# Patient Record
Sex: Female | Born: 1950 | Race: Black or African American | Hispanic: No | Marital: Married | Smoking: Former smoker
Health system: Southern US, Community
[De-identification: ages and names within clinical notes are randomized; demographics above are authoritative.]

## PROBLEM LIST (undated history)

## (undated) DIAGNOSIS — Z87898 Personal history of other specified conditions: Secondary | ICD-10-CM

## (undated) DIAGNOSIS — I739 Peripheral vascular disease, unspecified: Secondary | ICD-10-CM

## (undated) DIAGNOSIS — I6529 Occlusion and stenosis of unspecified carotid artery: Secondary | ICD-10-CM

## (undated) DIAGNOSIS — R0602 Shortness of breath: Secondary | ICD-10-CM

## (undated) DIAGNOSIS — Z8709 Personal history of other diseases of the respiratory system: Secondary | ICD-10-CM

## (undated) DIAGNOSIS — I071 Rheumatic tricuspid insufficiency: Secondary | ICD-10-CM

## (undated) HISTORY — DX: Shortness of breath: R06.02

## (undated) HISTORY — DX: Personal history of other diseases of the respiratory system: Z87.09

## (undated) HISTORY — DX: Rheumatic tricuspid insufficiency: I07.1

## (undated) HISTORY — DX: Occlusion and stenosis of unspecified carotid artery: I65.29

## (undated) HISTORY — DX: Peripheral vascular disease, unspecified: I73.9

## (undated) HISTORY — DX: Personal history of other specified conditions: Z87.898

---
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
## 2014-08-19 ENCOUNTER — Emergency Department (HOSPITAL_COMMUNITY): Payer: Medicaid Other

## 2014-08-19 ENCOUNTER — Emergency Department (HOSPITAL_COMMUNITY)
Admission: EM | Admit: 2014-08-19 | Discharge: 2014-08-19 | Disposition: A | Discharge status: Home / Self Care | Payer: Medicaid Other | Attending: Emergency Medicine | Admitting: Emergency Medicine

## 2014-08-19 ENCOUNTER — Encounter (HOSPITAL_COMMUNITY): Payer: Self-pay | Admitting: *Deleted

## 2014-08-19 ENCOUNTER — Emergency Department (HOSPITAL_BASED_OUTPATIENT_CLINIC_OR_DEPARTMENT_OTHER): Payer: Medicaid Other

## 2014-08-19 ENCOUNTER — Emergency Department (INDEPENDENT_AMBULATORY_CARE_PROVIDER_SITE_OTHER)
Admission: EM | Admit: 2014-08-19 | Discharge: 2014-08-19 | Disposition: A | Discharge status: Nursing Home (Includes ICF) | Payer: Medicaid Other | Source: Home / Self Care | Attending: Emergency Medicine | Admitting: Emergency Medicine

## 2014-08-19 DIAGNOSIS — M549 Dorsalgia, unspecified: Secondary | ICD-10-CM | POA: Diagnosis not present

## 2014-08-19 DIAGNOSIS — M79609 Pain in unspecified limb: Secondary | ICD-10-CM

## 2014-08-19 DIAGNOSIS — J441 Chronic obstructive pulmonary disease with (acute) exacerbation: Secondary | ICD-10-CM | POA: Diagnosis not present

## 2014-08-19 DIAGNOSIS — R059 Cough, unspecified: Secondary | ICD-10-CM

## 2014-08-19 DIAGNOSIS — M791 Myalgia, unspecified site: Secondary | ICD-10-CM

## 2014-08-19 DIAGNOSIS — R7981 Abnormal blood-gas level: Secondary | ICD-10-CM

## 2014-08-19 DIAGNOSIS — R2243 Localized swelling, mass and lump, lower limb, bilateral: Secondary | ICD-10-CM | POA: Diagnosis not present

## 2014-08-19 DIAGNOSIS — R600 Localized edema: Secondary | ICD-10-CM | POA: Diagnosis not present

## 2014-08-19 DIAGNOSIS — R05 Cough: Secondary | ICD-10-CM | POA: Diagnosis present

## 2014-08-19 DIAGNOSIS — G8929 Other chronic pain: Secondary | ICD-10-CM

## 2014-08-19 DIAGNOSIS — M25561 Pain in right knee: Secondary | ICD-10-CM | POA: Diagnosis not present

## 2014-08-19 DIAGNOSIS — Z720 Tobacco use: Secondary | ICD-10-CM

## 2014-08-19 DIAGNOSIS — M25562 Pain in left knee: Secondary | ICD-10-CM

## 2014-08-19 MED ORDER — ALBUTEROL SULFATE HFA 108 (90 BASE) MCG/ACT IN AERS
2.0000 | INHALATION_SPRAY | RESPIRATORY_TRACT | Status: DC | PRN
Start: 2014-08-19 — End: 2014-08-20
  Filled 2014-08-19: qty 6.7

## 2014-08-19 MED ORDER — HYDROCODONE-HOMATROPINE 5-1.5 MG/5ML PO SYRP: 5.0000 mL | ORAL_SOLUTION | Freq: Four times a day (QID) | ORAL | Status: DC | PRN

## 2014-08-19 MED ORDER — PREDNISONE 20 MG PO TABS
60.0000 mg | ORAL_TABLET | Freq: Once | ORAL | Status: AC
  Administered 2014-08-19: 60 mg via ORAL
  Filled 2014-08-19: qty 3

## 2014-08-19 MED ORDER — LEVOFLOXACIN 750 MG PO TABS: 750.0000 mg | ORAL_TABLET | Freq: Every day | ORAL | Status: DC

## 2014-08-19 MED ORDER — PREDNISONE 20 MG PO TABS: 60.0000 mg | ORAL_TABLET | Freq: Once | ORAL | Status: DC

## 2014-08-19 MED ORDER — LEVOFLOXACIN 750 MG PO TABS
750.0000 mg | ORAL_TABLET | Freq: Once | ORAL | Status: AC
  Administered 2014-08-19: 750 mg via ORAL
  Filled 2014-08-19: qty 1

## 2014-08-19 MED ORDER — PREDNISONE 20 MG PO TABS: 40.0000 mg | ORAL_TABLET | Freq: Every day | ORAL | Status: DC

## 2014-08-19 NOTE — ED Notes (Signed)
Pt ambulated approx. 25 feet oxygen sats maintained 92-93% on room air. Pt states minor sob and also severe leg pain. nad noted. PA notified.

## 2014-08-19 NOTE — ED Provider Notes (Signed)
CSN: [id number]     Arrival date & time 08/19/14  1821 History   First MD Initiated Contact with Patient 08/19/14 1930     Chief Complaint  Patient presents with  . Leg Swelling     (Consider location/radiation/quality/duration/timing/severity/associated sxs/prior Treatment) HPI Comments: Patient presents to the emergency department with chief complaint of bilateral leg swelling 2 months, but worsened in the past 2 days. Patient seen at urgent care earlier today and sent to ED for evaluation of lower extremity swelling. Patient recently arrived to the [place] from [place].  She denies any chest pain or shortness of breath.  She states that she has had a persistent cough x months.  She denies any associated fevers, chills, nausea, vomiting, diarrhea.  Denies history of DVT or PE.  The history is provided by the patient. No language interpreter was used.    History reviewed. No pertinent past medical history. Past Surgical History  Procedure Laterality Date  . Appendectomy     History reviewed. No pertinent family history. History  Substance Use Topics  . Smoking status: Never Smoker   . Smokeless tobacco: Not on file  . Alcohol Use: Yes   OB History    No data available     Review of Systems  Constitutional: Negative for fever and chills.  Respiratory: Positive for cough. Negative for shortness of breath.   Cardiovascular: Positive for leg swelling. Negative for chest pain.  Gastrointestinal: Negative for nausea, vomiting, diarrhea and constipation.  Genitourinary: Negative for dysuria.  All other systems reviewed and are negative.     Allergies  Review of patient's allergies indicates no known allergies.  Home Medications   Prior to Admission medications   Not on File   BP 126/72 mmHg  Pulse 70  Temp(Src) 98.2 F (36.8 C) (Oral)  Resp 18  SpO2 95% Physical Exam  Constitutional: She is oriented to person, place, and time. She appears well-developed and  well-nourished.  HENT:  Head: Normocephalic and atraumatic.  Eyes: Conjunctivae and EOM are normal. Pupils are equal, round, and reactive to light.  Neck: Normal range of motion. Neck supple.  Cardiovascular: Normal rate and regular rhythm.  Exam reveals no gallop and no friction rub.   No murmur heard. Pulmonary/Chest: Effort normal and breath sounds normal. No respiratory distress. She has no wheezes. She has no rales. She exhibits no tenderness.  CTAB  Abdominal: Soft. Bowel sounds are normal. She exhibits no distension and no mass. There is no tenderness. There is no rebound and no guarding.  Musculoskeletal: Normal range of motion. She exhibits edema. She exhibits no tenderness.  2+ pitting edema of lower extremities, no sign of abscess or cellulitis Moves all extremities No bony abnormality or deformity  Neurological: She is alert and oriented to person, place, and time.  Skin: Skin is warm and dry.  Psychiatric: She has a normal mood and affect. Her behavior is normal. Judgment and thought content normal.  Nursing note and vitals reviewed.   ED Course  Procedures (including critical care time) No results found for this or any previous visit. Dg Chest 2 View  08/19/2014   CLINICAL DATA:  Bilateral leg swelling x2 months, increased x2 days  EXAM: CHEST  2 VIEW  COMPARISON:  None.  FINDINGS: Patchy bilateral lower lobe opacities, atelectasis versus pneumonia. No frank interstitial edema. No pleural effusion or pneumothorax.  Heart is top-normal in size.  Mild degenerative changes of the visualized thoracolumbar spine.  IMPRESSION: Patchy bilateral lower lobe  opacities, atelectasis versus pneumonia.  No frank interstitial edema.   Electronically Signed   By: Charline Bills M.D.   On: 08/19/2014 22:02      EKG Interpretation None      MDM   Final diagnoses:  Cough  COPD exacerbation    Patient with bilateral lower extremity swelling, recent long travel from [place] to [place].  Will check lower extremity Dopplers.  Lower extremities are negative for DVT. No evidence of abscess or cellulitis. Patient has a history of chronic knee pain.  Patient has a significant amount coughing on exam. I will order chest x-ray for further investigation. Patient does not have any fever, chills, nausea, vomiting, diarrhea, doubt MERS.  Patient is a heavy smoker, oxygen saturation is on the low side at 92%. Chest x-ray shows bilateral opacities, consider pneumonia versus atelectasis. Patient discussed with Dr. Silverio Lay, who recommends ambulating the patient.  Doubt PE.  Negative DVTs.  No chest pain.  Cough is chronic.  No SOB.  Patient ambulates maintaining 92-93% oxygenation. This is likely the patient's baseline. Will treat opacities with Levaquin, prednisone, and an inhaler. Will also give Hycodan for cough/comfort. Patient understands and agrees with the plan. She is stable and ready for discharge.     Roxy Horseman, PA-C 08/19/14 2337  Richardean Canal, MD 08/20/14 [phone]

## 2014-08-19 NOTE — ED Notes (Signed)
Pt sent here from [facility], reports swelling to bilateral legs x 2 months but more severe x 2 days. Recent travel from [place]. Denies any cp or sob.

## 2014-08-19 NOTE — Discharge Instructions (Signed)
Cough, Adult  A cough is a reflex that helps clear your throat and airways. It can help heal the body or may be a reaction to an irritated airway. A cough may only last 2 or 3 weeks (acute) or may last more than 8 weeks (chronic).  CAUSES Acute cough:  Viral or bacterial infections. Chronic cough:  Infections.  Allergies.  Asthma.  Post-nasal drip.  Smoking.  Heartburn or acid reflux.  Some medicines.  Chronic lung problems (COPD).  Cancer. SYMPTOMS   Cough.  Fever.  Chest pain.  Increased breathing rate.  High-pitched whistling sound when breathing (wheezing).  Colored mucus that you cough up (sputum). TREATMENT   A bacterial cough may be treated with antibiotic medicine.  A viral cough must run its course and will not respond to antibiotics.  Your caregiver may recommend other treatments if you have a chronic cough. HOME CARE INSTRUCTIONS   Only take over-the-counter or prescription medicines for pain, discomfort, or fever as directed by your caregiver. Use cough suppressants only as directed by your caregiver.  Use a cold steam vaporizer or humidifier in your bedroom or home to help loosen secretions.  Sleep in a semi-upright position if your cough is worse at night.  Rest as needed.  Stop smoking if you smoke. SEEK IMMEDIATE MEDICAL CARE IF:   You have pus in your sputum.  Your cough starts to worsen.  You cannot control your cough with suppressants and are losing sleep.  You begin coughing up blood.  You have difficulty breathing.  You develop pain which is getting worse or is uncontrolled with medicine.  You have a fever. MAKE SURE YOU:   Understand these instructions.  Will watch your condition.  Will get help right away if you are not doing well or get worse. Document Released: 08/24/2010 Document Revised: 05/20/2011 Document Reviewed: 08/24/2010 [facility] Patient Information 2015 [place], [place]. This information is not intended  to replace advice given to you by your health care provider. Make sure you discuss any questions you have with your health care provider. Edema Edema is an abnormal buildup of fluids in your bodytissues. Edema is somewhatdependent on gravity to pull the fluid to the lowest place in your body. That makes the condition more common in the legs and thighs (lower extremities). Painless swelling of the feet and ankles is common and becomes more likely as you get older. It is also common in looser tissues, like around your eyes.  When the affected area is squeezed, the fluid may move out of that spot and leave a dent for a few moments. This dent is called pitting.  CAUSES  There are many possible causes of edema. Eating too much salt and being on your feet or sitting for a long time can cause edema in your legs and ankles. Hot weather may make edema worse. Common medical causes of edema include:  Heart failure.  Liver disease.  Kidney disease.  Weak blood vessels in your legs.  Cancer.  An injury.  Pregnancy.  Some medications.  Obesity. SYMPTOMS  Edema is usually painless.Your skin may look swollen or shiny.  DIAGNOSIS  Your health care provider may be able to diagnose edema by asking about your medical history and doing a physical exam. You may need to have tests such as X-rays, an electrocardiogram, or blood tests to check for medical conditions that may cause edema.  TREATMENT  Edema treatment depends on the cause. If you have heart, liver, or kidney disease,  you need the treatment appropriate for these conditions. General treatment may include:  Elevation of the affected body part above the level of your heart.  Compression of the affected body part. Pressure from elastic bandages or support stockings squeezes the tissues and forces fluid back into the blood vessels. This keeps fluid from entering the tissues.  Restriction of fluid and salt intake.  Use of a water pill  (diuretic). These medications are appropriate only for some types of edema. They pull fluid out of your body and make you urinate more often. This gets rid of fluid and reduces swelling, but diuretics can have side effects. Only use diuretics as directed by your health care provider. HOME CARE INSTRUCTIONS   Keep the affected body part above the level of your heart when you are lying down.   Do not sit still or stand for prolonged periods.   Do not put anything directly under your knees when lying down.  Do not wear constricting clothing or garters on your upper legs.   Exercise your legs to work the fluid back into your blood vessels. This may help the swelling go down.   Wear elastic bandages or support stockings to reduce ankle swelling as directed by your health care provider.   Eat a low-salt diet to reduce fluid if your health care provider recommends it.   Only take medicines as directed by your health care provider. SEEK MEDICAL CARE IF:   Your edema is not responding to treatment.  You have heart, liver, or kidney disease and notice symptoms of edema.  You have edema in your legs that does not improve after elevating them.   You have sudden and unexplained weight gain. SEEK IMMEDIATE MEDICAL CARE IF:   You develop shortness of breath or chest pain.   You cannot breathe when you lie down.  You develop pain, redness, or warmth in the swollen areas.   You have heart, liver, or kidney disease and suddenly get edema.  You have a fever and your symptoms suddenly get worse. MAKE SURE YOU:   Understand these instructions.  Will watch your condition.  Will get help right away if you are not doing well or get worse. Document Released: 02/25/2005 Document Revised: 07/12/2013 Document Reviewed: 12/18/2012 [facility] Patient Information 2015 [place], [place]. This information is not intended to replace advice given to you by your health care provider. Make sure you  discuss any questions you have with your health care provider.

## 2014-08-19 NOTE — Progress Notes (Signed)
VASCULAR LAB PRELIMINARY  PRELIMINARY  PRELIMINARY  PRELIMINARY  Bilateral lower extremity venous duplex completed.    Preliminary report:  Bilateral:  No evidence of DVT, superficial thrombosis, or Baker's Cyst.   Tiffanyann Deroo, RVS 08/19/2014, 8:58 PM

## 2014-08-19 NOTE — ED Notes (Signed)
Pt going to xray  

## 2014-08-19 NOTE — ED Provider Notes (Signed)
CSN: [id number]     Arrival date & time 08/19/14  1618 History   First MD Initiated Contact with Patient 08/19/14 1710     Chief Complaint  Patient presents with  . Foot Swelling   (Consider location/radiation/quality/duration/timing/severity/associated sxs/prior Treatment) HPI Comments: 64 year old morbidly obese [place] female arrived to [place]: [place] 2 days ago. There is an interpreter here with her. She is here for acute and chronic problems to be managed. She states that she has had back pain for 2 years and swollen legs for 2 months but has increased over the past few days. She is also complaining of acute on chronic bilateral knee pain. She denies chest pain or shortness of breath. She is a smoker. Most concerning is the fact that her bilateral lower extremity edema has increased over the past 10 days she has been traveling from [place]. She has been on several lengthy flights and has been immobile for several months. The pain in the knees are primarily with weightbearing and ambulation.   History reviewed. No pertinent past medical history. Past Surgical History  Procedure Laterality Date  . Appendectomy     History reviewed. No pertinent family history. History  Substance Use Topics  . Smoking status: Never Smoker   . Smokeless tobacco: Not on file  . Alcohol Use: Yes   OB History    No data available     Review of Systems  Constitutional: Negative for fever, activity change, appetite change and fatigue.  HENT: Negative.   Respiratory: Negative for cough and shortness of breath.   Cardiovascular: Positive for leg swelling. Negative for chest pain.  Gastrointestinal: Negative.   Genitourinary: Negative.   Musculoskeletal: Positive for myalgias, back pain, joint swelling, arthralgias and gait problem. Negative for neck pain.  Skin: Negative.   Neurological: Negative for syncope, speech difficulty and headaches.  Psychiatric/Behavioral: Negative.     Allergies  Review  of patient's allergies indicates no known allergies.  Home Medications   Prior to Admission medications   Not on File   BP 129/81 mmHg  Pulse 82  Temp(Src) 98.2 F (36.8 C) (Oral)  Resp 16  SpO2 92% Physical Exam  Pulmonary/Chest: Effort normal. No respiratory distress.  BS diminished bilaterally, distant faint wheezes. Pulse ox noted to be 92%  Musculoskeletal:  Tenderness to the lower mid and right back and hip musculature.  No tenderness to the right knee. Mild tenderness to the medial aspect of the left knee.  Knee extension to 180 deg. Flexion limited to 100 deg. LE with 1-2+pitting edema. No calf tenderness. Unable to palpate pedal pulses due to edema.  Neurological: She is alert. She exhibits normal muscle tone.  Skin: Skin is warm and dry. No erythema.  Nursing note and vitals reviewed.   ED Course  Procedures (including critical care time) Labs Review Labs Reviewed - No data to display  Imaging Review No results found.   MDM   1. Myalgia   2. Chronic back pain greater than 3 months duration   3. Bilateral chronic knee pain   4. Bilateral lower extremity edema   5. Low oxygen saturation   6. Tobacco abuse disorder    Transfer to the emergency department via shuttle primarily for evaluation of increased lower extremity edema after approximately 10 days of travel including flying and prolonged sitting in conjunction with long time tobacco use, obesity, deconditioning and immobility. Pulse ox is 92%.    Hayden Rasmussen, NP 08/19/14 1743

## 2014-08-19 NOTE — ED Notes (Addendum)
Pt  Reports  Pain  And    Swelling        Of  Both  Legs          She  Reports  Has  Had  The  Swelling  In  Past     But is  Worse  After  Recent  Flight  To  [place]  From  [place]         She  denys  Any  Chest  Pain  Or  Any     Shortness      Of breath         She    Is  A  Smoker   She  denys  Taking  Any  meds  Cap  Refill  Sluggish   Edema  Of  extremitys   Interpretor is   With  The  patient

## 2014-10-03 ENCOUNTER — Ambulatory Visit (INDEPENDENT_AMBULATORY_CARE_PROVIDER_SITE_OTHER): Payer: Medicaid Other | Admitting: Family Medicine

## 2014-10-03 VITALS — BP 131/60 | HR 77 | Temp 98.2°F | Ht 60.25 in | Wt 233.4 lb

## 2014-10-03 DIAGNOSIS — R0602 Shortness of breath: Secondary | ICD-10-CM | POA: Diagnosis not present

## 2014-10-03 DIAGNOSIS — R600 Localized edema: Secondary | ICD-10-CM | POA: Diagnosis not present

## 2014-10-03 DIAGNOSIS — Z789 Other specified health status: Secondary | ICD-10-CM

## 2014-10-03 DIAGNOSIS — Z0289 Encounter for other administrative examinations: Secondary | ICD-10-CM

## 2014-10-03 DIAGNOSIS — Z008 Encounter for other general examination: Secondary | ICD-10-CM

## 2014-10-03 DIAGNOSIS — Z758 Other problems related to medical facilities and other health care: Secondary | ICD-10-CM | POA: Insufficient documentation

## 2014-10-03 LAB — COMPREHENSIVE METABOLIC PANEL
ALBUMIN: 4 g/dL (ref 3.6–5.1)
ALK PHOS: 67 U/L (ref 33–130)
ALT: 14 U/L (ref 6–29)
AST: 12 U/L (ref 10–35)
BILIRUBIN TOTAL: 0.3 mg/dL (ref 0.2–1.2)
BUN: 19 mg/dL (ref 7–25)
CHLORIDE: 104 mmol/L (ref 98–110)
CO2: 29 mmol/L (ref 20–31)
CREATININE: 0.47 mg/dL — AB (ref 0.50–0.99)
Calcium: 9.4 mg/dL (ref 8.6–10.4)
GLUCOSE: 82 mg/dL (ref 65–99)
Potassium: 4.5 mmol/L (ref 3.5–5.3)
Sodium: 144 mmol/L (ref 135–146)
TOTAL PROTEIN: 6.8 g/dL (ref 6.1–8.1)

## 2014-10-03 LAB — CBC WITH DIFFERENTIAL/PLATELET
BASOS ABS: 0.1 10*3/uL (ref 0.0–0.1)
Basophils Relative: 1 % (ref 0–1)
EOS ABS: 0.2 10*3/uL (ref 0.0–0.7)
EOS PCT: 2 % (ref 0–5)
HEMATOCRIT: 42.2 % (ref 36.0–46.0)
Hemoglobin: 14 g/dL (ref 12.0–15.0)
LYMPHS ABS: 3.2 10*3/uL (ref 0.7–4.0)
Lymphocytes Relative: 29 % (ref 12–46)
MCH: 28.3 pg (ref 26.0–34.0)
MCHC: 33.2 g/dL (ref 30.0–36.0)
MCV: 85.3 fL (ref 78.0–100.0)
MONO ABS: 0.8 10*3/uL (ref 0.1–1.0)
MONOS PCT: 7 % (ref 3–12)
MPV: 12.1 fL (ref 8.6–12.4)
NEUTROS ABS: 6.6 10*3/uL (ref 1.7–7.7)
Neutrophils Relative %: 61 % (ref 43–77)
Platelets: 209 10*3/uL (ref 150–400)
RBC: 4.95 MIL/uL (ref 3.87–5.11)
RDW: 14.3 % (ref 11.5–15.5)
WBC: 10.9 10*3/uL — AB (ref 4.0–10.5)

## 2014-10-03 LAB — LIPID PANEL
CHOL/HDL RATIO: 5.7 ratio — AB (ref <=5.0)
Cholesterol: 224 mg/dL — ABNORMAL HIGH (ref 125–200)
HDL: 39 mg/dL — ABNORMAL LOW (ref >=46)
LDL Cholesterol: 153 mg/dL — ABNORMAL HIGH (ref <130)
Triglycerides: 162 mg/dL — ABNORMAL HIGH (ref <150)
VLDL: 32 mg/dL — AB (ref <30)

## 2014-10-03 MED ORDER — ALBUTEROL SULFATE HFA 108 (90 BASE) MCG/ACT IN AERS: 2.0000 | INHALATION_SPRAY | Freq: Four times a day (QID) | RESPIRATORY_TRACT | Status: DC | PRN

## 2014-10-03 NOTE — Assessment & Plan Note (Signed)
Likely COPD related given extensive smoking history SpO2 remains ~92% - which is likely baseline Albuterol prn for SOB F/u in 1 month in pharmacy clinic for inhaler teaching and PFTs

## 2014-10-03 NOTE — Assessment & Plan Note (Signed)
BMET today Encouraged elevation

## 2014-10-03 NOTE — Progress Notes (Addendum)
Phone arabic Mindi Junker Emmie Niemann [phone]) interpreter utilized during today's visit.  [facility]  HPI:  Patient presents to [facility] today for a new patient appointment to establish general primary care, also to discuss back and leg pain, leg swelling, and noise in ears.  Back pain - present for 2-3 years - takes some pain medicine that son got for her  Legs swollen and knee pain since coming to [place] - getting a little better, not gone - was seen in ED and ruled out for DVT  [place] sound in ears - present for ~ 2.24yrs, first occurred while in [place]  ROS: See HPI  Immigrant Social History: - Name spelling correct?: Frances [name] - Date arrived in [place]: August 16, 2014 - Country of origin: [place] - Location of refugee camp (if applicable), how long there, and what caused patient to leave home country?: stayed in [place], not in refugee camp, 4 years, war - Primary language: Arabic  -Requires Administrator (essentially speaks no [place]) - Education: Highest level of education: none, illiterate - Prior work: none, 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  no  Preventative Care History: -Seen at health department?: no - unsure of when appt is   Past Medical Hx:  -no known  Past Surgical Hx:  -appendectomy around age 84  Family Hx: updated in Epic - Number of family members:  Daughter left in [place], 1 daughter in [place] and 1 daughter in [place], 1 daughter in [place] - Number of family members in [place]:  Son (29), daughter-in-law, 4 grandchildren  PHYSICAL EXAM: BP 131/60 mmHg  Pulse 77  Temp(Src) 98.2 F (36.8 C) (Oral)  Ht 5' 0.25" (1.53 m)  Wt 233 lb 6.4 oz (105.87 kg)  BMI 45.23 kg/m2 Gen: NAD, sitting comfortably  HEENT:  NCAT, MMM, OP clear, PERRL, EOMI, anicteric sclera, TMs clear b/l Neck:  Supple Heart: RRR, no m/r/g, intact distal pulses Lungs: CTAB, no w/r/c. Normal WOB, does breath harder when walking, though Abdomen: Soft, NTND, no HSM, no rebound/guarding Skin:  No rashes noted MSK: Hesitant gait, 2+ pitting edema to shins b/l Neuro: Alert, interactive, no focal deficits noted Psych: Mood stable, affect appropriate  Examined and interviewed with Dr. Gwendolyn Grant  FOLLOW UP: F/u in 1 month for pharmacy clinic for inhaler teaching and PFTs.  Erasmo Downer, MD, MPH PGY-2,  [facility] 10/03/2014 4:42 PM

## 2014-10-03 NOTE — Assessment & Plan Note (Signed)
Labs collected today Patient to be seen at HD for formal examination

## 2014-10-03 NOTE — Patient Instructions (Signed)
It was very good to see you today.   We are testing bloodwork.   Come back to see [place] in about 1 month for a breathing test.    We are checking blood work today.

## 2014-10-04 LAB — SICKLE CELL SCREEN: SICKLE CELL SCREEN: NEGATIVE

## 2014-10-04 LAB — HEPATITIS B SURFACE ANTIGEN: HEP B S AG: NEGATIVE

## 2014-10-04 LAB — RPR

## 2014-10-04 LAB — HIV ANTIBODY (ROUTINE TESTING W REFLEX): HIV: NONREACTIVE

## 2014-10-04 LAB — VARICELLA ZOSTER ANTIBODY, IGG: Varicella IgG: 475.5 Index — ABNORMAL HIGH (ref <135.00)

## 2014-10-05 ENCOUNTER — Encounter: Payer: Self-pay | Admitting: Family Medicine

## 2014-10-05 ENCOUNTER — Telehealth: Payer: Self-pay | Admitting: Family Medicine

## 2014-10-05 LAB — QUANTIFERON TB GOLD ASSAY (BLOOD)
Interferon Gamma Release Assay: NEGATIVE
Mitogen value: >10 IU/mL
QUANTIFERON NIL VALUE: 0.13 [IU]/mL
Quantiferon Tb Ag Minus Nil Value: 0 IU/mL
TB Ag value: 0.11 IU/mL

## 2014-10-05 NOTE — Telephone Encounter (Signed)
Labs faxed

## 2014-10-05 NOTE — Addendum Note (Signed)
Addended by: WALDEN, JEFFREY H on: 10/05/2014 01:42 PM   Modules accepted: Level of Service  

## 2014-10-05 NOTE — Telephone Encounter (Signed)
[facility] Dept called and they would like copies of the patient Labs and what test if any were done faxed to them at [phone]. jw

## 2014-11-11 ENCOUNTER — Emergency Department (HOSPITAL_COMMUNITY)
Admission: EM | Admit: 2014-11-11 | Discharge: 2014-11-12 | Disposition: A | Discharge status: Home / Self Care | Payer: Medicaid Other | Attending: Emergency Medicine | Admitting: Emergency Medicine

## 2014-11-11 ENCOUNTER — Encounter (HOSPITAL_COMMUNITY): Payer: Self-pay

## 2014-11-11 DIAGNOSIS — M7989 Other specified soft tissue disorders: Secondary | ICD-10-CM | POA: Insufficient documentation

## 2014-11-11 DIAGNOSIS — Z79899 Other long term (current) drug therapy: Secondary | ICD-10-CM | POA: Diagnosis not present

## 2014-11-11 DIAGNOSIS — M79605 Pain in left leg: Secondary | ICD-10-CM | POA: Diagnosis present

## 2014-11-11 DIAGNOSIS — M545 Low back pain: Secondary | ICD-10-CM | POA: Diagnosis not present

## 2014-11-11 NOTE — ED Notes (Signed)
Assessments done with interpreter phone and MD at bedside

## 2014-11-11 NOTE — ED Notes (Addendum)
Pt sts she has had swelling in her legs x 3 months and lower back pain x 6 months.  Pt sts she was set up with a primary care doctor approx 3 months ago, but was not prescribed any medications and has not gone back since.

## 2014-11-11 NOTE — ED Notes (Signed)
Pt here for bilateral leg pain and swelling as well as right lower back pain.

## 2014-11-11 NOTE — ED Provider Notes (Signed)
CSN: [id number]     Arrival date & time 11/11/14  2241 History  This chart was scribed for Jerelyn Scott, MD by Freida Busman, ED Scribe. This patient was seen in room A13C/A13C and the patient's care was started 11:52 PM.  Chief Complaint  Patient presents with  . Leg Pain  . Back Pain    Patient is a 64 y.o. female presenting with leg pain and back pain. The history is provided by the patient. A language interpreter was used.  Leg Pain Chronicity:  Recurrent Dislocation: no   Foreign body present:  No foreign bodies Associated symptoms: back pain   Back Pain Chronicity:  Chronic Associated symptoms: leg pain    HPI Comments:  Frances [name] is a 64 y.o. female who presents to the Emergency Department complaining of 7/10 back pain and 10/10 BLE pain. She reports swelling to her bilateral feet and knees. She states she has been experiencing these symptoms for "awhile". Pt reports a history of the back pain for ~ 6 months and the BLE pain/swelling for at least 3 months. She notes her symptoms are progressively worsening. No alleviating factors noted. Language interpreter used due to language barrier.   History reviewed. No pertinent past medical history. Past Surgical History  Procedure Laterality Date  . Appendectomy  1964   No family history on file. Social History  Substance Use Topics  . Smoking status: Never Smoker   . Smokeless tobacco: None  . Alcohol Use: Yes   OB History    No data available     Review of Systems  Cardiovascular: Positive for leg swelling (BLE).  Musculoskeletal: Positive for myalgias (BLE) and back pain.  ROS reviewed and all otherwise negative except for mentioned in HPI  Allergies  Review of patient's allergies indicates no known allergies.  Home Medications   Prior to Admission medications   Medication Sig Start Date End Date Taking? Authorizing Provider  albuterol (PROVENTIL HFA;VENTOLIN HFA) 108 (90 BASE) MCG/ACT inhaler Inhale 2 puffs  into the lungs every 6 (six) hours as needed for wheezing or shortness of breath. 10/03/14  Yes Erasmo Downer, MD  cyclobenzaprine (FLEXERIL) 5 MG tablet Take 1 tablet (5 mg total) by mouth 3 (three) times daily as needed for muscle spasms. 11/12/14   Jerelyn Scott, MD  naproxen (NAPROSYN) 500 MG tablet Take 1 tablet (500 mg total) by mouth 2 (two) times daily. 11/12/14   Jerelyn Scott, MD  oxyCODONE-acetaminophen (PERCOCET/ROXICET) 5-325 MG per tablet Take 1-2 tablets by mouth every 6 (six) hours as needed for severe pain. 11/12/14   Jerelyn Scott, MD   BP 130/52 mmHg  Pulse 81  Temp(Src) 99 F (37.2 C) (Oral)  Resp 20  SpO2 90%  Vitals reviewed Physical Exam  Physical Examination: General appearance - alert, well appearing, and in no distress Mental status - alert, oriented to person, place, and time Eyes - no conjunctival injection, no scleral icterus Mouth - mucous membranes moist, pharynx normal without lesions Neck - supple, no significant adenopathy Chest - clear to auscultation, no wheezes, rales or rhonchi, symmetric air entry, decreased air movement Heart - normal rate, regular rhythm, normal S1, S2, no murmurs, rubs, clicks or gallops Abdomen - soft, nontender, nondistended, no masses or organomegaly Neurological - alert, oriented, normal speech, strength 5/5 in extremities x 4, sensation intact Extremities - peripheral pulses normal, general swelling of bilateral lower legs, no pitting edema, no clubbing or cyanosis Back- left paraspinal tenderness in lumbar region, no CVA  tenderness Skin - normal coloration and turgor, no rashes  ED Course  Procedures   DIAGNOSTIC STUDIES:  Oxygen Saturation is 97% on [place], normal by my interpretation.    COORDINATION OF CARE:  11:54 PM Discussed treatment plan with pt at bedside and pt agreed to plan.  Labs Review Labs Reviewed  BASIC METABOLIC PANEL - Abnormal; Notable for the following:    Glucose, Bld 123 (*)    All other  components within normal limits  CBC  BRAIN NATRIURETIC PEPTIDE    Imaging Review Dg Chest 2 View  11/12/2014   CLINICAL DATA:  Acute onset of bilateral leg swelling. Initial encounter.  EXAM: CHEST  2 VIEW  COMPARISON:  Chest radiograph performed 08/19/2014  FINDINGS: The lungs are well-aerated. Vascular congestion is noted. Increased interstitial markings raise concern for mild pulmonary edema. No definite pleural effusion or pneumothorax is seen.  The heart is borderline enlarged. No acute osseous abnormalities are seen.  IMPRESSION: Vascular congestion and borderline cardiomegaly. Increased interstitial markings raise concern for mild pulmonary edema.   Electronically Signed   By: Roanna Raider M.D.   On: 11/12/2014 01:04   I have personally reviewed and evaluated these images and lab results as part of my medical decision-making.   EKG Interpretation None      MDM   Final diagnoses:  Swelling of lower extremity  Low back pain without sciatica, unspecified back pain laterality     1:51 AM at time of discharge patient noted to have decreased O2 sats- approx 88%, she has documented low O2 with COPD, she has very mild wheezing on exam, will give albuterol/atrovent and reassess.  BNP is normal, CXR without infiltrate.  She does smoke-- this is likely due to chronic bronchitis/copd and continued smoking.    Pt with chronic lower exremity swelling and chronic low back pain, swelling is nonpitting, pt treated with pain meds for back pain in the ED.  Labs are reassuring, no sign of renal failure, BNP reassuring.  CXR with vascular congestion put no frank pulmonary edema.  Pt with borderline O2 sats- per chart review this has been present in the past, hx of COPD- improved after duoneb in the ED.  Pt does not perceive being short of breath, she does smoke cigarettes.  D/w translator phone the importance of using the inhaler that she has at home and following up with her primary care doctor.   Discharged with strict return precautions.  Pt agreeable with plan.  I personally performed the services described in this documentation, which was scribed in my presence. The recorded information has been reviewed and is accurate.    Jerelyn Scott, MD 11/13/14 [phone]

## 2014-11-12 ENCOUNTER — Emergency Department (HOSPITAL_COMMUNITY): Payer: Medicaid Other

## 2014-11-12 LAB — BASIC METABOLIC PANEL
ANION GAP: 7 (ref 5–15)
BUN: 17 mg/dL (ref 6–20)
CHLORIDE: 103 mmol/L (ref 101–111)
CO2: 30 mmol/L (ref 22–32)
Calcium: 8.9 mg/dL (ref 8.9–10.3)
Creatinine, Ser: 0.57 mg/dL (ref 0.44–1.00)
GFR calc Af Amer: >60 mL/min (ref >60)
GFR calc non Af Amer: >60 mL/min (ref >60)
Glucose, Bld: 123 mg/dL — ABNORMAL HIGH (ref 65–99)
Potassium: 3.7 mmol/L (ref 3.5–5.1)
Sodium: 140 mmol/L (ref 135–145)

## 2014-11-12 LAB — CBC
HCT: 40.5 % (ref 36.0–46.0)
HEMOGLOBIN: 13.1 g/dL (ref 12.0–15.0)
MCH: 29.5 pg (ref 26.0–34.0)
MCHC: 32.3 g/dL (ref 30.0–36.0)
MCV: 91.2 fL (ref 78.0–100.0)
Platelets: 167 10*3/uL (ref 150–400)
RBC: 4.44 MIL/uL (ref 3.87–5.11)
RDW: 13.9 % (ref 11.5–15.5)
WBC: 10 10*3/uL (ref 4.0–10.5)

## 2014-11-12 LAB — BRAIN NATRIURETIC PEPTIDE: B NATRIURETIC PEPTIDE 5: 45.5 pg/mL (ref 0.0–100.0)

## 2014-11-12 MED ORDER — ALBUTEROL SULFATE (2.5 MG/3ML) 0.083% IN NEBU
5.0000 mg | INHALATION_SOLUTION | Freq: Once | RESPIRATORY_TRACT | Status: AC
  Administered 2014-11-12: 5 mg via RESPIRATORY_TRACT
  Filled 2014-11-12: qty 6

## 2014-11-12 MED ORDER — NAPROXEN 500 MG PO TABS: 500.0000 mg | ORAL_TABLET | Freq: Two times a day (BID) | ORAL | Status: DC

## 2014-11-12 MED ORDER — CYCLOBENZAPRINE HCL 5 MG PO TABS: 5.0000 mg | ORAL_TABLET | Freq: Three times a day (TID) | ORAL | Status: DC | PRN

## 2014-11-12 MED ORDER — MORPHINE SULFATE (PF) 4 MG/ML IV SOLN
4.0000 mg | Freq: Once | INTRAVENOUS | Status: AC
  Administered 2014-11-12: 4 mg via INTRAVENOUS
  Filled 2014-11-12: qty 1

## 2014-11-12 MED ORDER — OXYCODONE-ACETAMINOPHEN 5-325 MG PO TABS: 1.0000 | ORAL_TABLET | Freq: Four times a day (QID) | ORAL | Status: DC | PRN

## 2014-11-12 MED ORDER — IPRATROPIUM BROMIDE 0.02 % IN SOLN
0.5000 mg | Freq: Once | RESPIRATORY_TRACT | Status: AC
  Administered 2014-11-12: 0.5 mg via RESPIRATORY_TRACT
  Filled 2014-11-12: qty 2.5

## 2014-11-12 NOTE — ED Notes (Signed)
MD made aware of pt's saturations.  Spoke to MD about pt history.  Will provide order for breathing treatment

## 2014-11-12 NOTE — Discharge Instructions (Signed)
Return to the ED with any concerns including weakness of legs, not able to urinate, loss of control of bowel or bladder, difficulty breathing, chest pain, decreased level of alertness/lethargy, or any other alarming symptoms

## 2014-11-12 NOTE — ED Notes (Addendum)
This RN used interpreter phone to explain to pt about her oxygen saturations, why we are giving her a breathing treatment, and all of her discharge instructions, including medications, smoking cessation, follow-up appointment, and reasons to return to the ED.  Pt verbalized understanding of the need to follow up with her PCP.  Pt sts she has an appointment for the end of September.  Pt was given the opportunity to ask any questions she needs.  All questions answered, and pt verbalized understanding of conversation.  Conversation occurred in front of her son, who speaks some english.

## 2014-11-12 NOTE — ED Notes (Addendum)
Pt's saturations off of oxygen for approx 2 minutes got down to 88%.  Pt placed back on 2L [place] - O2 saturations at 92%

## 2015-04-12 ENCOUNTER — Ambulatory Visit: Payer: Medicaid Other | Admitting: Family Medicine

## 2015-06-09 ENCOUNTER — Encounter: Payer: Self-pay | Admitting: Vascular Surgery

## 2015-06-16 ENCOUNTER — Encounter: Payer: Self-pay | Admitting: Vascular Surgery

## 2015-06-27 ENCOUNTER — Ambulatory Visit (INDEPENDENT_AMBULATORY_CARE_PROVIDER_SITE_OTHER): Payer: Self-pay | Admitting: Vascular Surgery

## 2015-06-27 ENCOUNTER — Encounter: Payer: Self-pay | Admitting: Vascular Surgery

## 2015-06-27 VITALS — BP 128/62 | HR 91 | Temp 98.1°F | Resp 20 | Ht 64.0 in | Wt 243.0 lb

## 2015-06-27 DIAGNOSIS — M545 Low back pain: Secondary | ICD-10-CM

## 2015-06-27 DIAGNOSIS — I745 Embolism and thrombosis of iliac artery: Secondary | ICD-10-CM

## 2015-06-27 NOTE — Progress Notes (Signed)
[facility]  Patient name: Frances [name] MRN: [id number] DOB: 06-12-1950 Sex: female  REASON FOR VISIT: right leg pain  HPI: Frances [name] is a 65 y.o. female, who presents for evaluation of right lower extremity pain. The patient is a refugee from [place] and does not speak [place]. The history is conducted through the and interpreter. Her son is present. The patient complains of pain in her right lower back, buttock and right hip at rest and lying down. She states that this pain is severe enough that it limits her ambulation. She ambulates with a cane. The patient has history of lower back pain. She has not seen a spine physician in the past. She has been going to [facility] where she has had an echo (normal), CTA and multiple lab tests. The patient and son are frustrated about going from doctor to doctor without any answers.  The patient is a heavy smoker. She takes a daily aspirin. She past medical history of tricuspid regurgitation and carotid artery stenosis.   Past Medical History  Diagnosis Date  . SOB (shortness of breath)   . Tricuspid regurgitation   . History of dyspnea   . Peripheral vascular disease (HCC)   . Carotid artery occlusion     No family history on file.  SOCIAL HISTORY: Social History   Social History  . Marital Status: Married    Spouse Name: N/A  . Number of Children: N/A  . Years of Education: N/A   Occupational History  . Not on file.   Social History Main Topics  . Smoking status: Never Smoker   . Smokeless tobacco: Never Used  . Alcohol Use: 0.0 oz/week    0 Standard drinks or equivalent per week  . Drug Use: No  . Sexual Activity: Not on file   Other Topics Concern  . Not on file   Social History Narrative    No Known Allergies  Current Outpatient Prescriptions  Medication Sig Dispense Refill  . albuterol (PROVENTIL HFA;VENTOLIN HFA) 108 (90 BASE) MCG/ACT inhaler Inhale 2 puffs into the  lungs every 6 (six) hours as needed for wheezing or shortness of breath. 1 Inhaler 2  . aspirin 325 MG tablet Take 325 mg by mouth daily.    . cyclobenzaprine (FLEXERIL) 5 MG tablet Take 1 tablet (5 mg total) by mouth 3 (three) times daily as needed for muscle spasms. 20 tablet 0  . lisinopril (PRINIVIL,ZESTRIL) 5 MG tablet Take 5 mg by mouth daily.    . metoprolol succinate (TOPROL-XL) 25 MG 24 hr tablet Take 25 mg by mouth daily.    . naproxen (NAPROSYN) 500 MG tablet Take 1 tablet (500 mg total) by mouth 2 (two) times daily. 30 tablet 0  . oxyCODONE-acetaminophen (PERCOCET/ROXICET) 5-325 MG per tablet Take 1-2 tablets by mouth every 6 (six) hours as needed for severe pain. (Patient not taking: Reported on 06/27/2015) 6 tablet 0   No current facility-administered medications for this visit.    REVIEW OF SYSTEMS:   denotes positive finding,  denotes negative finding Cardiac  Comments:  Chest pain or chest pressure: x   Shortness of breath upon exertion: x   Short of breath when lying flat:    Irregular heart rhythm:        Vascular    Pain in calf, thigh, or hip brought on by ambulation: x   Pain in feet at night that wakes you up from your sleep:  Blood clot in your veins:    Leg swelling:  x       Pulmonary    Oxygen at home:    Productive cough:     Wheezing:         Neurologic    Sudden weakness in arms or legs:     Sudden numbness in arms or legs:     Sudden onset of difficulty speaking or slurred speech:    Temporary loss of vision in one eye:     Problems with dizziness:         Gastrointestinal    Blood in stool:     Vomited blood:         Genitourinary    Burning when urinating:     Blood in urine:        Psychiatric    Major depression:         Hematologic    Bleeding problems:    Problems with blood clotting too easily:        Skin    Rashes or ulcers:        Constitutional    Fever or chills:      PHYSICAL EXAM: Filed Vitals:    06/27/15 1429  BP: 128/62  Pulse: 91  Temp: 98.1 F (36.7 C)  Resp: 20  Height: 5\' 4"  (1.626 m)  Weight: 243 lb (110.224 kg)  SpO2: 90%    GENERAL: The patient is a well-nourished obese female, in no acute distress. The vital signs are documented above. VASCULAR: Palpable left dorsalis pedis pulse. Biphasic left posterior tibial and dorsalis pedis Doppler signals. Monophasic right dorsalis pedis and posterior tibial Doppler signals. PULMONARY: Nonlabored respiratory effort. MUSCULOSKELETAL: There are no major deformities or cyanosis. NEUROLOGIC: No focal weakness or paresthesias are detected. SKIN: There are no ulcers or rashes noted. PSYCHIATRIC: The patient has a normal affect.  DATA:  CTA abdomen pelvis report from outside facility shows occlusion of right common iliac artery with reconstitution of the right external and internal iliac arteries.  The images are not available for review.  MEDICAL ISSUES: Back pain and right lower extremity pain  The patient has an occluded right common iliac artery based on the report from a CTA from an outside facility. The patient has lower back pain, buttock and hip pain at rest. She is unable to ambulate much due to this pain. This is unlikely secondary to her right common iliac artery occlusion. Her pain is likely from her lower back. We will obtain CT images from her outside facility. Discussed that vascular intervention is unlikely to yield any results as her pain is likely of a spinal etiology. We will refer her to a spine specialist in town. She will follow up on an as-needed basis.   Maris BergerKimberly Trinh, PA-C [facility] [place] [phone]  I have examined the patient, reviewed and agree with above. Had a very long discussion via the interpreter with the patient and her son present. Difficult to explain the issue of right iliac occlusion with the symptoms not related to this. She is very uncomfortable getting on and off  the examining table. Has constant pain in her low back extending down her right hip and leg. Is very difficult time walking to the level of claudication. Explained that with good Doppler flow in her right foot that she has no issue regarding tissue loss. Does have palpable left dorsalis pedis pulse. Explained that the occluded right common iliac artery could  be corrected either with femorofemoral bypass or potentially stenting if this could be crossed but that this would not affect her symptoms of severe back buttock and leg pain with lying flat or sitting. We will attempt to refer her to a spine specialist. Patient is Medicaid which may make this somewhat difficult.  Gretta Began, MD 06/27/2015 5:03 PM

## 2015-06-28 NOTE — Addendum Note (Signed)
Addended by: Sharee PimpleMCCHESNEY, Numan Zylstra K on: 06/28/2015 12:50 PM   Modules accepted: Orders

## 2015-07-03 ENCOUNTER — Telehealth: Payer: Self-pay | Admitting: *Deleted

## 2015-07-03 NOTE — Telephone Encounter (Signed)
Spoke with son, Shirline FreesMohammed, at [phone], per Dr. Bosie HelperEarly's request.  I told her son that [facility] Ar[facility] Co[facility]Early [facility] and "it was what we thought, no need for vascular surgery or follow up with us".  Referred to spine specialist.  Son voiced understanding.   Sent this radiology report and Dr. Bosie HelperEarly's written note to be scanned into Patient's file.

## 2015-07-06 ENCOUNTER — Encounter: Payer: Self-pay | Admitting: Family Medicine

## 2015-10-07 ENCOUNTER — Inpatient Hospital Stay (HOSPITAL_COMMUNITY)
Admission: EM | Admit: 2015-10-07 | Discharge: 2015-10-11 | DRG: 291 | Disposition: A | Discharge status: Home Health | Payer: Medicaid Other | Attending: Family Medicine | Admitting: Family Medicine

## 2015-10-07 ENCOUNTER — Emergency Department (HOSPITAL_COMMUNITY): Payer: Medicaid Other

## 2015-10-07 ENCOUNTER — Encounter (HOSPITAL_COMMUNITY): Payer: Self-pay | Admitting: Emergency Medicine

## 2015-10-07 DIAGNOSIS — B029 Zoster without complications: Secondary | ICD-10-CM | POA: Diagnosis not present

## 2015-10-07 DIAGNOSIS — R600 Localized edema: Secondary | ICD-10-CM | POA: Diagnosis present

## 2015-10-07 DIAGNOSIS — R0603 Acute respiratory distress: Secondary | ICD-10-CM | POA: Diagnosis present

## 2015-10-07 DIAGNOSIS — E872 Acidosis: Secondary | ICD-10-CM | POA: Diagnosis present

## 2015-10-07 DIAGNOSIS — I739 Peripheral vascular disease, unspecified: Secondary | ICD-10-CM | POA: Diagnosis present

## 2015-10-07 DIAGNOSIS — R0602 Shortness of breath: Secondary | ICD-10-CM | POA: Diagnosis present

## 2015-10-07 DIAGNOSIS — I5033 Acute on chronic diastolic (congestive) heart failure: Secondary | ICD-10-CM | POA: Diagnosis present

## 2015-10-07 DIAGNOSIS — Z720 Tobacco use: Secondary | ICD-10-CM | POA: Diagnosis not present

## 2015-10-07 DIAGNOSIS — E662 Morbid (severe) obesity with alveolar hypoventilation: Secondary | ICD-10-CM | POA: Diagnosis present

## 2015-10-07 DIAGNOSIS — M545 Low back pain, unspecified: Secondary | ICD-10-CM | POA: Diagnosis present

## 2015-10-07 DIAGNOSIS — E8770 Fluid overload, unspecified: Secondary | ICD-10-CM | POA: Diagnosis present

## 2015-10-07 DIAGNOSIS — J9621 Acute and chronic respiratory failure with hypoxia: Secondary | ICD-10-CM | POA: Diagnosis present

## 2015-10-07 DIAGNOSIS — Z6841 Body Mass Index (BMI) 40.0 and over, adult: Secondary | ICD-10-CM

## 2015-10-07 DIAGNOSIS — G8929 Other chronic pain: Secondary | ICD-10-CM | POA: Diagnosis present

## 2015-10-07 DIAGNOSIS — G9341 Metabolic encephalopathy: Secondary | ICD-10-CM | POA: Diagnosis present

## 2015-10-07 DIAGNOSIS — J441 Chronic obstructive pulmonary disease with (acute) exacerbation: Secondary | ICD-10-CM

## 2015-10-07 DIAGNOSIS — G9349 Other encephalopathy: Secondary | ICD-10-CM | POA: Diagnosis not present

## 2015-10-07 DIAGNOSIS — R0600 Dyspnea, unspecified: Secondary | ICD-10-CM | POA: Diagnosis not present

## 2015-10-07 DIAGNOSIS — R55 Syncope and collapse: Secondary | ICD-10-CM | POA: Diagnosis present

## 2015-10-07 DIAGNOSIS — R072 Precordial pain: Secondary | ICD-10-CM | POA: Diagnosis not present

## 2015-10-07 DIAGNOSIS — G4733 Obstructive sleep apnea (adult) (pediatric): Secondary | ICD-10-CM | POA: Diagnosis not present

## 2015-10-07 DIAGNOSIS — R079 Chest pain, unspecified: Secondary | ICD-10-CM | POA: Diagnosis present

## 2015-10-07 DIAGNOSIS — F17210 Nicotine dependence, cigarettes, uncomplicated: Secondary | ICD-10-CM | POA: Diagnosis present

## 2015-10-07 DIAGNOSIS — I110 Hypertensive heart disease with heart failure: Principal | ICD-10-CM | POA: Diagnosis present

## 2015-10-07 DIAGNOSIS — R911 Solitary pulmonary nodule: Secondary | ICD-10-CM | POA: Diagnosis present

## 2015-10-07 DIAGNOSIS — J9622 Acute and chronic respiratory failure with hypercapnia: Secondary | ICD-10-CM | POA: Diagnosis not present

## 2015-10-07 DIAGNOSIS — I071 Rheumatic tricuspid insufficiency: Secondary | ICD-10-CM | POA: Diagnosis present

## 2015-10-07 LAB — COMPREHENSIVE METABOLIC PANEL
ALBUMIN: 3.4 g/dL — AB (ref 3.5–5.0)
ALK PHOS: 70 U/L (ref 38–126)
ALT: 12 U/L — AB (ref 14–54)
ANION GAP: 6 (ref 5–15)
AST: 12 U/L — ABNORMAL LOW (ref 15–41)
BILIRUBIN TOTAL: 0.8 mg/dL (ref 0.3–1.2)
BUN: 14 mg/dL (ref 6–20)
CALCIUM: 8.9 mg/dL (ref 8.9–10.3)
CO2: 34 mmol/L — ABNORMAL HIGH (ref 22–32)
CREATININE: 0.43 mg/dL — AB (ref 0.44–1.00)
Chloride: 97 mmol/L — ABNORMAL LOW (ref 101–111)
GFR calc Af Amer: >60 mL/min (ref >60)
GFR calc non Af Amer: >60 mL/min (ref >60)
GLUCOSE: 110 mg/dL — AB (ref 65–99)
Potassium: 3.8 mmol/L (ref 3.5–5.1)
Sodium: 137 mmol/L (ref 135–145)
TOTAL PROTEIN: 7.3 g/dL (ref 6.5–8.1)

## 2015-10-07 LAB — I-STAT CHEM 8, ED
BUN: 14 mg/dL (ref 6–20)
CALCIUM ION: 1.2 mmol/L (ref 1.12–1.23)
CHLORIDE: 94 mmol/L — AB (ref 101–111)
Creatinine, Ser: 0.7 mg/dL (ref 0.44–1.00)
GLUCOSE: 106 mg/dL — AB (ref 65–99)
HCT: 47 % — ABNORMAL HIGH (ref 36.0–46.0)
Hemoglobin: 16 g/dL — ABNORMAL HIGH (ref 12.0–15.0)
Potassium: 3.9 mmol/L (ref 3.5–5.1)
Sodium: 139 mmol/L (ref 135–145)
TCO2: 34 mmol/L (ref 0–100)

## 2015-10-07 LAB — CBC WITH DIFFERENTIAL/PLATELET
BASOS ABS: 0.1 10*3/uL (ref 0.0–0.1)
BASOS PCT: 1 %
EOS ABS: 0.2 10*3/uL (ref 0.0–0.7)
EOS PCT: 2 %
HCT: 46 % (ref 36.0–46.0)
Hemoglobin: 14.4 g/dL (ref 12.0–15.0)
Lymphocytes Relative: 23 %
Lymphs Abs: 2.3 10*3/uL (ref 0.7–4.0)
MCH: 28.3 pg (ref 26.0–34.0)
MCHC: 31.3 g/dL (ref 30.0–36.0)
MCV: 90.4 fL (ref 78.0–100.0)
MONO ABS: 0.9 10*3/uL (ref 0.1–1.0)
Monocytes Relative: 9 %
Neutro Abs: 6.7 10*3/uL (ref 1.7–7.7)
Neutrophils Relative %: 65 %
PLATELETS: 186 10*3/uL (ref 150–400)
RBC: 5.09 MIL/uL (ref 3.87–5.11)
RDW: 15.6 % — AB (ref 11.5–15.5)
WBC: 10.2 10*3/uL (ref 4.0–10.5)

## 2015-10-07 LAB — BLOOD GAS, VENOUS
Acid-Base Excess: 5.8 mmol/L — ABNORMAL HIGH (ref 0.0–2.0)
BICARBONATE: 34.3 meq/L — AB (ref 20.0–24.0)
O2 Content: 4 L/min
O2 Saturation: 99.1 %
PH VEN: 7.305 — AB (ref 7.250–7.300)
PO2 VEN: 197 mmHg — AB (ref 31.0–45.0)
Patient temperature: 99.4
TCO2: 30.7 mmol/L (ref 0–100)
pCO2, Ven: 71.5 mmHg (ref 45.0–50.0)

## 2015-10-07 LAB — BRAIN NATRIURETIC PEPTIDE: B Natriuretic Peptide: 182 pg/mL — ABNORMAL HIGH (ref 0.0–100.0)

## 2015-10-07 LAB — I-STAT TROPONIN, ED: TROPONIN I, POC: 0.03 ng/mL (ref 0.00–0.08)

## 2015-10-07 LAB — I-STAT CG4 LACTIC ACID, ED: Lactic Acid, Venous: 0.77 mmol/L (ref 0.5–1.9)

## 2015-10-07 LAB — PROTIME-INR
INR: 1
PROTHROMBIN TIME: 13.2 s (ref 11.4–15.2)

## 2015-10-07 LAB — APTT: APTT: 30 s (ref 24–36)

## 2015-10-07 MED ORDER — HEPARIN BOLUS VIA INFUSION
2300.0000 [IU] | Freq: Once | INTRAVENOUS | Status: AC
  Administered 2015-10-07: 2300 [IU] via INTRAVENOUS
  Filled 2015-10-07: qty 2300

## 2015-10-07 MED ORDER — IPRATROPIUM-ALBUTEROL 0.5-2.5 (3) MG/3ML IN SOLN
3.0000 mL | Freq: Once | RESPIRATORY_TRACT | Status: AC
  Administered 2015-10-07: 3 mL via RESPIRATORY_TRACT
  Filled 2015-10-07: qty 3

## 2015-10-07 MED ORDER — IOPAMIDOL (ISOVUE-370) INJECTION 76%
100.0000 mL | Freq: Once | INTRAVENOUS | Status: AC | PRN
  Administered 2015-10-07: 85 mL via INTRAVENOUS

## 2015-10-07 MED ORDER — HEPARIN (PORCINE) IN NACL 100-0.45 UNIT/ML-% IJ SOLN
1300.0000 [IU]/h | INTRAMUSCULAR | Status: DC
  Administered 2015-10-07: 1300 [IU]/h via INTRAVENOUS
  Filled 2015-10-07: qty 250

## 2015-10-07 MED ORDER — METHYLPREDNISOLONE SODIUM SUCC 125 MG IJ SOLR
125.0000 mg | Freq: Once | INTRAMUSCULAR | Status: AC
  Administered 2015-10-07: 125 mg via INTRAVENOUS
  Filled 2015-10-07: qty 2

## 2015-10-07 NOTE — ED Triage Notes (Signed)
Pt presents from home with complaints of bilateral leg swelling that began about 1 year ago, chest pain x 1 day, recurrent LOC that have been happening all day. Pt's family states that pt has passed out "so many times today". They are unable to tell me how many times. Pt has what sounds like a productive cough that has been going on "for a long time" but got worse 2 days ago. Pt was 75% on RA, but increased to 99% on 2L. Per Dr. Madilyn Hook, pt was moved to 4L.  Pt's chest pain is centrally located, and she states she does not feel radiation, but she does feel nausea. Pt denies emesis or diarrhea.

## 2015-10-07 NOTE — ED Notes (Signed)
Patient transported to CT 

## 2015-10-07 NOTE — Progress Notes (Addendum)
ANTICOAGULATION CONSULT NOTE - Initial Consult  Pharmacy Consult for Heparin Indication: pulmonary embolus  No Known Allergies  Patient Measurements: Height: 5\' 2"  (157.5 cm) Weight: 243 lb (110.2 kg) IBW/kg (Calculated) : 50.1 Heparin Dosing Weight: 77 kg  Vital Signs: Temp: 99.4 F (37.4 C) (07/29 2002) Temp Source: Oral (07/29 2002) BP: 127/58 (07/29 2002) Pulse Rate: 84 (07/29 2002)  Labs: No results for input(s): HGB, HCT, PLT, APTT, LABPROT, INR, HEPARINUNFRC, HEPRLOWMOCWT, CREATININE, CKTOTAL, CKMB, TROPONINI in the last 72 hours.  CrCl cannot be calculated (Patient's most recent lab result is older than the maximum 21 days allowed.).   Medical History: Past Medical History:  Diagnosis Date  . Carotid artery occlusion   . History of dyspnea   . Peripheral vascular disease (HCC)   . SOB (shortness of breath)   . Tricuspid regurgitation     Assessment: 87 yoF presents with chest pain, SOB, syncope. Has history of bilateral leg swelling.  Pharmacy consulted to start heparin for r/o PE.  Goal of Therapy:  Heparin level 0.3-0.7 units/ml Monitor platelets by anticoagulation protocol: Yes   Plan:  Baseline PTT, PT/INR, CBC ordered STAT. Heparin 2300 unit IV bolus then start infusion at 1300 units/hr per [place] nomogram. Check heparin level in 6 hours.  Clance Boll 10/07/2015,8:17 PM   ADDENDUM: 10/07/2015 9:14 PM Baseline PTT, PT/INR WNL. Hgb 16.0.  Pt in CT now for chest scan.

## 2015-10-07 NOTE — H&P (Signed)
PCP:   Bernerd Limbo   Chief Complaint:  Syncope  HPI: History and translation provided by daughter Jossie Ng. Patient doesn't speak [place], primary language arabic. This is a 65 year old female was been having chest pains on and off for approximately 4-5 months. She has not been to see a PCP. She has had chest pains on and off for the last 4-5 months, throat pain and lower extremity swelling. She is also had shortness of breath for the same length of time. Yesterday all symptoms got worse.  Her chest pains centrally located, with no radiation. It is described as a pressure. It is not aggravated with movement or improved with rest. It is not reproducible. She states her pain is continuous. At its worse is 6/10, currently 5/10. She has no history of cardiac disease. She denies family history of cardiac disease. The patient smokes daily. There is no report of any fevers or chills.  The patient has been short of breath and coughing. She has increased lower extremity edema. The patient wheezed mildly in the ER.. Today the patient syncopized for a few times. It is unclear the patient syncopized or she was hypersomnolent and fell asleep.  Per ER physician the patient has received treatment for COPD exacerbation. She had nebulizers and her shortness of breath and symptoms have improved. She was initially in 5 L oxygen, now 2 L after one nebulizer. She is also received Solu-Medrol IV. She started wheezing after nebulizer given per ER physician.  History provided by the patient's daughter Jossie Ng [phone].  Review of Systems:  The patient denies anorexia, fever, weight loss, vision loss, decreased hearing, hoarseness, chest pain, syncope, dyspnea on exertion, peripheral edema, balance deficits, hemoptysis, abdominal pain, melena, hematochezia, severe indigestion/heartburn, hematuria, incontinence, genital sores, muscle weakness, suspicious skin lesions, transient blindness, difficulty walking, depression,  unusual weight change, abnormal bleeding, enlarged lymph nodes, angioedema, and breast masses.  Past Medical History: Past Medical History:  Diagnosis Date  . Carotid artery occlusion   . History of dyspnea   . Peripheral vascular disease (HCC)   . SOB (shortness of breath)   . Tricuspid regurgitation    Past Surgical History:  Procedure Laterality Date  . APPENDECTOMY  1964    Medications: Prior to Admission medications   Medication Sig Start Date End Date Taking? Authorizing Provider  DM-Doxylamine-Acetaminophen (NYQUIL COLD & FLU PO) Take 1 capsule by mouth every 6 (six) hours as needed (for throat pain).   Yes Historical Provider, MD  Phenylephrine-DM-GG (MUCINEX FAST-MAX CONGEST COUGH) 2.5-5-100 MG/5ML LIQD Take 5 mLs by mouth every 6 (six) hours as needed (for cough).   Yes Historical Provider, MD    Allergies:  No Known Allergies  Social History:  reports that she has been smoking Cigarettes.  She has never used smokeless tobacco. She reports that she drinks alcohol. She reports that she does not use drugs.  Family History: History reviewed. No pertinent family history.  Physical Exam: Vitals:   10/07/15 2209 10/07/15 2212 10/07/15 2222 10/07/15 2242  BP: 153/86   (!) 127/49  Pulse: 74   73  Resp: (!) 27   (!) 30  Temp:      TempSrc:      SpO2: 92% 91% 93% (!) 88%  Weight:      Height:        General:  Alert and oriented times three, well developed and nourished, no acute distress Eyes: PERRLA, pink conjunctiva, no scleral icterus ENT: Moist oral mucosa, neck supple, no thyromegaly  Lungs: mild wheeze, no crackles, no use of accessory muscles Cardiovascular: regular rate and rhythm, no regurgitation, no gallops, no murmurs. No carotid bruits, no JVD Abdomen: soft, positive BS, non-tender, non-distended, no organomegaly, not an acute abdomen GU: not examined Neuro: CN II - XII grossly intact, sensation intact Musculoskeletal: strength 5/5 all extremities, no  clubbing, cyanosis or mild LE edema Skin: no rash, no subcutaneous crepitation, no decubitus Psych: appropriate patient   Labs on Admission:   Recent Labs  10/07/15 2031 10/07/15 2040  NA 137 139  K 3.8 3.9  CL 97* 94*  CO2 34*  --   GLUCOSE 110* 106*  BUN 14 14  CREATININE 0.43* 0.70  CALCIUM 8.9  --     Recent Labs  10/07/15 2031  AST 12*  ALT 12*  ALKPHOS 70  BILITOT 0.8  PROT 7.3  ALBUMIN 3.4*   No results for input(s): LIPASE, AMYLASE in the last 72 hours.  Recent Labs  10/07/15 2031 10/07/15 2040  WBC 10.2  --   NEUTROABS 6.7  --   HGB 14.4 16.0*  HCT 46.0 47.0*  MCV 90.4  --   PLT 186  --    No results for input(s): CKTOTAL, CKMB, CKMBINDEX, TROPONINI in the last 72 hours. Invalid input(s): POCBNP No results for input(s): DDIMER in the last 72 hours. No results for input(s): HGBA1C in the last 72 hours. No results for input(s): CHOL, HDL, LDLCALC, TRIG, CHOLHDL, LDLDIRECT in the last 72 hours. No results for input(s): TSH, T4TOTAL, T3FREE, THYROIDAB in the last 72 hours.  Invalid input(s): FREET3 No results for input(s): VITAMINB12, FOLATE, FERRITIN, TIBC, IRON, RETICCTPCT in the last 72 hours.  Micro Results: No results found for this or any previous visit (from the past 240 hour(s)).   Radiological Exams on Admission: Ct Angio Chest Pe W/cm &/or Wo Cm  Result Date: 10/07/2015 CLINICAL DATA:  Bilateral leg swelling with chest pain EXAM: CT ANGIOGRAPHY CHEST WITH CONTRAST TECHNIQUE: Multidetector CT imaging of the chest was performed using the standard protocol during bolus administration of intravenous contrast. Multiplanar CT image reconstructions and MIPs were obtained to evaluate the vascular anatomy. CONTRAST:  85 mL Isovue 370 COMPARISON:  None. FINDINGS: Mediastinum/Lymph Nodes: No significant hilar or mediastinal adenopathy is identified. Scattered small mediastinal nodes are seen. The esophagus as visualized is within normal limits.  Scattered small axillary lymph nodes are noted bilaterally with normal fatty hila. Cardiovascular: Mild aortic calcifications are noted without aneurysmal dilatation or dissection. The pulmonary artery shows a normal branching pattern without evidence of pulmonary embolism. Lungs/Pleura: Dependent atelectatic changes are noted right greater than left. No focal confluent infiltrate is seen. No sizable effusion is noted. A parenchymal nodule is noted within the right upper lobe measuring approximately 7 mm. No other parenchymal nodules are seen. Upper abdomen: No acute findings. Musculoskeletal: No chest wall mass or suspicious bone lesions identified. Review of the MIP images confirms the above findings. IMPRESSION: Mild dependent atelectatic changes bilaterally. 7 mm right upper lobe nodule laterally. Non-contrast chest CT at 6-12 months is recommended. If the nodule is stable at time of repeat CT, then future CT at 18-24 months (from today's scan) is considered optional for low-risk patients, but is recommended for high-risk patients. This recommendation follows the consensus statement: Guidelines for Management of Incidental Pulmonary Nodules Detected on CT Images:From the Fleischner Society 2017; published online before print (10.1148/radiol.1610960454). No evidence of pulmonary emboli. Electronically Signed   By: Eulah Pont.D.  On: 10/07/2015 21:48  Dg Chest Port 1 View  Result Date: 10/07/2015 CLINICAL DATA:  Shortness of breath EXAM: PORTABLE CHEST 1 VIEW COMPARISON:  11/12/2014 FINDINGS: Cardiac shadow is enlarged. The lungs are well aerated bilaterally. Increased vascular congestion with mild interstitial edema is noted. No bony abnormality is seen. IMPRESSION: Changes of mild CHF. Electronically Signed   By: Alcide Clever M.D.   On: 10/07/2015 20:42  EKG: Normal sinus rhythm  Assessment/Plan Present on Admission: . Syncope -Admit to med telemetry  . Chest pain -Cycle cardiac  enzymes -Nitropaste ordered -Lipid panel in a.m. -Baby aspirin daily -Inpatient team toward a stress test Monday  . Lower extremity edema Mild CHF and chest x-ray -2-D echo ordered for a.m. -Lasix 20 mg every 12 hours IV -BNP in a.m.  . SOB (shortness of breath) -Multifactorial, may be due to cardiac versus COPD issues. -Workup underway   Tobacco abuse -Nicotine patch, duonebs as needed  Hypercapnia Possible COPD exacerbation -Likely due to COPD with chronic hypercapnia. -Patient normally range, no BiPAP ordered -Solu-Medrol 40 mg IV every 8 hours ordered. Her ER physician patient was wheezing in the ER. Scant wheezes appreciated  Pulmonary nodule -In a patient who smokes. See guidelines  Morbid obesity -Aware   Mylin Gignac 10/07/2015, 11:08 PM

## 2015-10-07 NOTE — ED Provider Notes (Signed)
[facility]-EMERGENCY DEPT Provider Note   CSN: [id number] Arrival date & time: 10/07/15  4782  First Provider Contact:  First MD Initiated Contact with Patient 10/07/15 2004        History   Chief Complaint Chief Complaint  Patient presents with  . Shortness of Breath  . Chest Pain  . Loss of Consciousness    HPI Frances [name] is a 65 y.o. female.  The history is provided by the patient. No language interpreter was used.  Shortness of Breath  Associated symptoms include chest pain and shortness of breath.  Chest Pain  Associated symptoms: chest pain, shortness of breath and syncope   Loss of Consciousness   Associated symptoms include chest pain and syncope.    Frances [name] is a 65 y.o. female who presents to the Emergency Department complaining of SOB, syncope.  History is provided by the patient and her daughter. She presents for evaluation of chest pain, shortness of breath, syncope. Patient has had about a year of bilateral lower extremity edema. Over the last few days her edema has significantly worsened. Last night she developed sharp chest pain with increased cough and shortness of breath. She's had multiple syncopal events today. She denies any fevers, abdominal pain. She is tried NyQuil and Mucinex at home with no improvement in her symptoms.  Past Medical History:  Diagnosis Date  . Carotid artery occlusion   . History of dyspnea   . Peripheral vascular disease (HCC)   . SOB (shortness of breath)   . Tricuspid regurgitation     Patient Active Problem List   Diagnosis Date Noted  . Syncope 10/07/2015  . Tobacco abuse 10/07/2015  . Chest pain 10/07/2015  . SOB (shortness of breath) 10/03/2014  . Language barrier 10/03/2014  . Lower extremity edema 10/03/2014  . Refugee health examination 10/03/2014    Past Surgical History:  Procedure Laterality Date  . APPENDECTOMY  1964    OB History    No data available       Home Medications    Prior to  Admission medications   Medication Sig Start Date End Date Taking? Authorizing Provider  DM-Doxylamine-Acetaminophen (NYQUIL COLD & FLU PO) Take 1 capsule by mouth every 6 (six) hours as needed (for throat pain).   Yes Historical Provider, MD  Phenylephrine-DM-GG (MUCINEX FAST-MAX CONGEST COUGH) 2.5-5-100 MG/5ML LIQD Take 5 mLs by mouth every 6 (six) hours as needed (for cough).   Yes Historical Provider, MD    Family History History reviewed. No pertinent family history.  Social History Social History  Substance Use Topics  . Smoking status: Current Every Day Smoker    Types: Cigarettes  . Smokeless tobacco: Never Used  . Alcohol use 0.0 oz/week     Allergies   Review of patient's allergies indicates no known allergies.   Review of Systems Review of Systems  Respiratory: Positive for shortness of breath.   Cardiovascular: Positive for chest pain and syncope.  All other systems reviewed and are negative.    Physical Exam Updated Vital Signs BP (!) 127/49 (BP Location: Right Arm)   Pulse 73   Temp 99.4 F (37.4 C) (Oral)   Resp (!) 30   Ht  (1.575 m)   Wt 243 lb (110.2 kg)   SpO2 90%   BMI 44.45 kg/m   Physical Exam  Constitutional: She is oriented to person, place, and time. She appears well-developed and well-nourished. She appears distressed.  HENT:  Head: Normocephalic and atraumatic.  Cardiovascular: Normal rate and regular rhythm.   No murmur heard. Pulmonary/Chest:  Tachypnea, decreased air movement bilaterally  Abdominal: Soft. There is no tenderness. There is no rebound and no guarding.  Musculoskeletal: She exhibits no tenderness.  2-3+ edema to bilateral lower extremities. 2+ left DP pulse. Faint right DP pulse.  Neurological: She is alert and oriented to person, place, and time.  Skin: Skin is warm and dry.  Psychiatric: She has a normal mood and affect. Her behavior is normal.  Nursing note and vitals reviewed.    ED Treatments / Results    Labs (all labs ordered are listed, but only abnormal results are displayed) Labs Reviewed  COMPREHENSIVE METABOLIC PANEL - Abnormal; Notable for the following:       Result Value   Chloride 97 (*)    CO2 34 (*)    Glucose, Bld 110 (*)    Creatinine, Ser 0.43 (*)    Albumin 3.4 (*)    AST 12 (*)    ALT 12 (*)    All other components within normal limits  CBC WITH DIFFERENTIAL/PLATELET - Abnormal; Notable for the following:    RDW 15.6 (*)    All other components within normal limits  BRAIN NATRIURETIC PEPTIDE - Abnormal; Notable for the following:    B Natriuretic Peptide 182.0 (*)    All other components within normal limits  BLOOD GAS, VENOUS - Abnormal; Notable for the following:    pH, Ven 7.305 (*)    pCO2, Ven 71.5 (*)    pO2, Ven 197.0 (*)    Bicarbonate 34.3 (*)    Acid-Base Excess 5.8 (*)    All other components within normal limits  I-STAT CHEM 8, ED - Abnormal; Notable for the following:    Chloride 94 (*)    Glucose, Bld 106 (*)    Hemoglobin 16.0 (*)    HCT 47.0 (*)    All other components within normal limits  APTT  PROTIME-INR  I-STAT TROPOININ, ED  I-STAT CG4 LACTIC ACID, ED    EKG  EKG Interpretation  Date/Time:  Saturday October 07 2015 20:02:08 EDT Ventricular Rate:  89 PR Interval:    QRS Duration: 86 QT Interval:  336 QTC Calculation: 409 R Axis:   51 Text Interpretation:  Sinus rhythm Left atrial enlargement Borderline T abnormalities, inferior leads Confirmed by Lincoln Brigham [phone]) on 10/07/2015 8:28:00 PM       Radiology Ct Angio Chest Pe W/cm &/or Wo Cm  Result Date: 10/07/2015 CLINICAL DATA:  Bilateral leg swelling with chest pain EXAM: CT ANGIOGRAPHY CHEST WITH CONTRAST TECHNIQUE: Multidetector CT imaging of the chest was performed using the standard protocol during bolus administration of intravenous contrast. Multiplanar CT image reconstructions and MIPs were obtained to evaluate the vascular anatomy. CONTRAST:  85 mL Isovue 370  COMPARISON:  None. FINDINGS: Mediastinum/Lymph Nodes: No significant hilar or mediastinal adenopathy is identified. Scattered small mediastinal nodes are seen. The esophagus as visualized is within normal limits. Scattered small axillary lymph nodes are noted bilaterally with normal fatty hila. Cardiovascular: Mild aortic calcifications are noted without aneurysmal dilatation or dissection. The pulmonary artery shows a normal branching pattern without evidence of pulmonary embolism. Lungs/Pleura: Dependent atelectatic changes are noted right greater than left. No focal confluent infiltrate is seen. No sizable effusion is noted. A parenchymal nodule is noted within the right upper lobe measuring approximately 7 mm. No other parenchymal nodules are seen. Upper abdomen: No acute findings. Musculoskeletal: No chest wall mass or  suspicious bone lesions identified. Review of the MIP images confirms the above findings. IMPRESSION: Mild dependent atelectatic changes bilaterally. 7 mm right upper lobe nodule laterally. Non-contrast chest CT at 6-12 months is recommended. If the nodule is stable at time of repeat CT, then future CT at 18-24 months (from today's scan) is considered optional for low-risk patients, but is recommended for high-risk patients. This recommendation follows the consensus statement: Guidelines for Management of Incidental Pulmonary Nodules Detected on CT Images:From the Fleischner Society 2017; published online before print (10.1148/radiol.1610960454). No evidence of pulmonary emboli. Electronically Signed   By: Alcide Clever M.D.   On: 10/07/2015 21:48  Dg Chest Port 1 View  Result Date: 10/07/2015 CLINICAL DATA:  Shortness of breath EXAM: PORTABLE CHEST 1 VIEW COMPARISON:  11/12/2014 FINDINGS: Cardiac shadow is enlarged. The lungs are well aerated bilaterally. Increased vascular congestion with mild interstitial edema is noted. No bony abnormality is seen. IMPRESSION: Changes of mild CHF.  Electronically Signed   By: Alcide Clever M.D.   On: 10/07/2015 20:42   Procedures Procedures (including critical care time)  Medications Ordered in ED Medications  iopamidol (ISOVUE-370) 76 % injection 100 mL (85 mLs Intravenous Contrast Given 10/07/15 2132)  heparin bolus via infusion 2,300 Units (2,300 Units Intravenous Bolus from Bag 10/07/15 2208)  ipratropium-albuterol (DUONEB) 0.5-2.5 (3) MG/3ML nebulizer solution 3 mL (3 mLs Nebulization Given 10/07/15 2212)  methylPREDNISolone sodium succinate (SOLU-MEDROL) 125 mg/2 mL injection 125 mg (125 mg Intravenous Given 10/07/15 2242)  ipratropium-albuterol (DUONEB) 0.5-2.5 (3) MG/3ML nebulizer solution 3 mL (3 mLs Nebulization Given 10/07/15 2321)     Initial Impression / Assessment and Plan / ED Course  I have reviewed the triage vital signs and the nursing notes.  Pertinent labs & imaging results that were available during my care of the patient were reviewed by me and considered in my medical decision making (see chart for details).  Clinical Course    Patient with history of smoking but no other medical problems per patient and family here with chest pain, shortness of breath, syncope. Initial concern for PE given her hypoxia and symptoms and she was started on a heparin drip. CT PE study is negative for acute pulmonary embolism. She was treated with albuterol and on repeat lung assessment she has improved air movement with end expiratory wheezes bilaterally. She did feel improved on the nasal cannula oxygen as well as following the nebulizer treatment. Provided with the additional albuterol, Solumedrol for COPD exacerbation with hypoxia and plan to admit for further treatment.  Final Clinical Impressions(s) / ED Diagnoses   Final diagnoses:  COPD with acute exacerbation [facility])    New Prescriptions New Prescriptions   No medications on file     Tilden Fossa, MD 10/07/15 2335

## 2015-10-08 ENCOUNTER — Inpatient Hospital Stay (HOSPITAL_COMMUNITY): Payer: Medicaid Other

## 2015-10-08 ENCOUNTER — Encounter (HOSPITAL_COMMUNITY): Payer: Self-pay

## 2015-10-08 DIAGNOSIS — R55 Syncope and collapse: Secondary | ICD-10-CM

## 2015-10-08 DIAGNOSIS — R072 Precordial pain: Secondary | ICD-10-CM

## 2015-10-08 DIAGNOSIS — R0602 Shortness of breath: Secondary | ICD-10-CM

## 2015-10-08 DIAGNOSIS — G9349 Other encephalopathy: Secondary | ICD-10-CM

## 2015-10-08 LAB — BLOOD GAS, ARTERIAL
ACID-BASE EXCESS: 9.8 mmol/L — AB (ref 0.0–2.0)
Acid-Base Excess: 7 mmol/L — ABNORMAL HIGH (ref 0.0–2.0)
BICARBONATE: 36.7 meq/L — AB (ref 20.0–24.0)
BICARBONATE: 38.8 meq/L — AB (ref 20.0–24.0)
Delivery systems: POSITIVE
Drawn by: 441371
EXPIRATORY PAP: 6
FIO2: 0.3
INSPIRATORY PAP: 12
LHR: 10 {breaths}/min
Mode: POSITIVE
O2 Content: 3 L/min
O2 SAT: 86.9 %
O2 Saturation: 88 %
PATIENT TEMPERATURE: 97.8
PATIENT TEMPERATURE: 98.7
PCO2 ART: 78.3 mmHg — AB (ref 35.0–45.0)
PCO2 ART: 75.9 mmHg — AB (ref 35.0–45.0)
PH ART: 7.33 — AB (ref 7.350–7.450)
PO2 ART: 56.2 mmHg — AB (ref 80.0–100.0)
TCO2: 33.2 mmol/L (ref 0–100)
TCO2: 34.7 mmol/L (ref 0–100)
pH, Arterial: 7.29 — ABNORMAL LOW (ref 7.350–7.450)
pO2, Arterial: 57.7 mmHg — ABNORMAL LOW (ref 80.0–100.0)

## 2015-10-08 LAB — ECHOCARDIOGRAM COMPLETE
CHL CUP MV DEC (S): 180
E decel time: 180 msec
E/e' ratio: 19.73
FS: 40 % (ref 28–44)
HEIGHTINCHES: 59 in
IV/PV OW: 0.95
LA diam end sys: 42 mm
LADIAMINDEX: 1.9 cm/m2
LASIZE: 42 mm
LAVOL: 62.5 mL
LAVOLA4C: 60.8 mL
LAVOLIN: 28.2 mL/m2
LV E/e' medial: 19.73
LV e' LATERAL: 5.98 cm/s
LVEEAVG: 19.73
LVOT area: 2.84 cm2
LVOT diameter: 19 mm
Lateral S' vel: 12.5 cm/s
MV Peak grad: 6 mmHg
MV pk A vel: 138 m/s
MVPKEVEL: 118 m/s
PW: 12.3 mm — AB (ref 0.6–1.1)
TAPSE: 16.9 mm
TDI e' lateral: 5.98
TDI e' medial: 4.9
WEIGHTICAEL: 3883.2 [oz_av]

## 2015-10-08 LAB — TSH: TSH: 0.997 u[IU]/mL (ref 0.350–4.500)

## 2015-10-08 LAB — BASIC METABOLIC PANEL
ANION GAP: 8 (ref 5–15)
BUN: 13 mg/dL (ref 6–20)
CALCIUM: 8.9 mg/dL (ref 8.9–10.3)
CO2: 33 mmol/L — ABNORMAL HIGH (ref 22–32)
Chloride: 97 mmol/L — ABNORMAL LOW (ref 101–111)
Creatinine, Ser: 0.54 mg/dL (ref 0.44–1.00)
GFR calc Af Amer: >60 mL/min (ref >60)
Glucose, Bld: 134 mg/dL — ABNORMAL HIGH (ref 65–99)
POTASSIUM: 4.4 mmol/L (ref 3.5–5.1)
SODIUM: 138 mmol/L (ref 135–145)

## 2015-10-08 LAB — LIPID PANEL
CHOL/HDL RATIO: 5.7 ratio
CHOLESTEROL: 201 mg/dL — AB (ref 0–200)
HDL: 35 mg/dL — ABNORMAL LOW (ref >40)
LDL Cholesterol: 144 mg/dL — ABNORMAL HIGH (ref 0–99)
Triglycerides: 110 mg/dL (ref <150)
VLDL: 22 mg/dL (ref 0–40)

## 2015-10-08 LAB — CBC
HCT: 48.3 % — ABNORMAL HIGH (ref 36.0–46.0)
Hemoglobin: 14.4 g/dL (ref 12.0–15.0)
MCH: 27.2 pg (ref 26.0–34.0)
MCHC: 29.8 g/dL — AB (ref 30.0–36.0)
MCV: 91.3 fL (ref 78.0–100.0)
PLATELETS: 204 10*3/uL (ref 150–400)
RBC: 5.29 MIL/uL — ABNORMAL HIGH (ref 3.87–5.11)
RDW: 16 % — AB (ref 11.5–15.5)
WBC: 10.4 10*3/uL (ref 4.0–10.5)

## 2015-10-08 LAB — MRSA PCR SCREENING: MRSA by PCR: NEGATIVE

## 2015-10-08 LAB — TROPONIN I
TROPONIN I: 0.05 ng/mL — AB (ref <0.03)
TROPONIN I: 0.04 ng/mL — AB (ref <0.03)
Troponin I: 0.05 ng/mL (ref <0.03)

## 2015-10-08 MED ORDER — ACETAMINOPHEN 325 MG PO TABS
650.0000 mg | ORAL_TABLET | Freq: Four times a day (QID) | ORAL | Status: DC | PRN
  Administered 2015-10-11: 650 mg via ORAL
  Filled 2015-10-08: qty 2

## 2015-10-08 MED ORDER — NITROGLYCERIN 2 % TD OINT
0.5000 [in_us] | TOPICAL_OINTMENT | Freq: Three times a day (TID) | TRANSDERMAL | Status: DC
  Administered 2015-10-08 – 2015-10-09 (×4): 0.5 [in_us] via TOPICAL
  Filled 2015-10-08: qty 30

## 2015-10-08 MED ORDER — NICOTINE 14 MG/24HR TD PT24
14.0000 mg | MEDICATED_PATCH | Freq: Every day | TRANSDERMAL | Status: DC
  Administered 2015-10-08 – 2015-10-11 (×4): 14 mg via TRANSDERMAL
  Filled 2015-10-08 (×4): qty 1

## 2015-10-08 MED ORDER — ONDANSETRON HCL 4 MG PO TABS: 4.0000 mg | ORAL_TABLET | Freq: Four times a day (QID) | ORAL | Status: DC | PRN

## 2015-10-08 MED ORDER — CHLORHEXIDINE GLUCONATE 0.12 % MT SOLN
15.0000 mL | Freq: Two times a day (BID) | OROMUCOSAL | Status: DC
  Administered 2015-10-08 – 2015-10-09 (×2): 15 mL via OROMUCOSAL
  Filled 2015-10-08: qty 15

## 2015-10-08 MED ORDER — ASPIRIN EC 81 MG PO TBEC
81.0000 mg | DELAYED_RELEASE_TABLET | Freq: Every day | ORAL | Status: DC
Start: 2015-10-08 — End: 2015-10-11
  Administered 2015-10-08 – 2015-10-11 (×4): 81 mg via ORAL
  Filled 2015-10-08 (×4): qty 1

## 2015-10-08 MED ORDER — SODIUM CHLORIDE 0.9% FLUSH
3.0000 mL | Freq: Two times a day (BID) | INTRAVENOUS | Status: DC
  Administered 2015-10-08 – 2015-10-10 (×7): 3 mL via INTRAVENOUS

## 2015-10-08 MED ORDER — FUROSEMIDE 10 MG/ML IJ SOLN
20.0000 mg | Freq: Two times a day (BID) | INTRAMUSCULAR | Status: AC
  Administered 2015-10-08 (×2): 20 mg via INTRAVENOUS
  Filled 2015-10-08 (×2): qty 2

## 2015-10-08 MED ORDER — ENOXAPARIN SODIUM 60 MG/0.6ML ~~LOC~~ SOLN
55.0000 mg | SUBCUTANEOUS | Status: DC
  Administered 2015-10-08 – 2015-10-11 (×4): 55 mg via SUBCUTANEOUS
  Filled 2015-10-08 (×4): qty 0.6

## 2015-10-08 MED ORDER — ONDANSETRON HCL 4 MG/2ML IJ SOLN: 4.0000 mg | Freq: Four times a day (QID) | INTRAMUSCULAR | Status: DC | PRN

## 2015-10-08 MED ORDER — ACETAMINOPHEN 650 MG RE SUPP: 650.0000 mg | Freq: Four times a day (QID) | RECTAL | Status: DC | PRN

## 2015-10-08 MED ORDER — METHYLPREDNISOLONE SODIUM SUCC 125 MG IJ SOLR
60.0000 mg | Freq: Three times a day (TID) | INTRAMUSCULAR | Status: DC
  Administered 2015-10-08 – 2015-10-09 (×4): 60 mg via INTRAVENOUS
  Filled 2015-10-08 (×4): qty 2

## 2015-10-08 MED ORDER — SENNOSIDES-DOCUSATE SODIUM 8.6-50 MG PO TABS: 1.0000 | ORAL_TABLET | Freq: Every evening | ORAL | Status: DC | PRN

## 2015-10-08 MED ORDER — CETYLPYRIDINIUM CHLORIDE 0.05 % MT LIQD
7.0000 mL | Freq: Two times a day (BID) | OROMUCOSAL | Status: DC
  Administered 2015-10-11: 7 mL via OROMUCOSAL

## 2015-10-08 MED ORDER — IPRATROPIUM-ALBUTEROL 0.5-2.5 (3) MG/3ML IN SOLN: 3.0000 mL | RESPIRATORY_TRACT | Status: DC | PRN

## 2015-10-08 NOTE — Progress Notes (Signed)
Increased IPAP to 18cm due to tidal volumes <200. After change tidal volumes increased to >400.

## 2015-10-08 NOTE — Progress Notes (Signed)
  Echocardiogram 2D Echocardiogram has been performed.  Delcie Roch 10/08/2015, 9:42 AM

## 2015-10-08 NOTE — Progress Notes (Signed)
CRITICAL VALUE ALERT  Critical value received:  Troponin 0.05  Date of notification:  10/08/15  Time of notification:  0259  Critical value read back:Yes.    Nurse who received alert:  Vella Redhead  MD notified (1st page):  Tama Gander  Time of first page: 0301  MD notified (2nd page):  Time of second page:  Responding MD:No response  Time MD responded:  No response

## 2015-10-08 NOTE — Consult Note (Signed)
Reason for Consult:sob and elevated troponin in setting of chest pressure  Referring Physician: Dr. Frederik Pear Frances [name] is an 65 y.o. female.   HPI: The patient is a 64 yo woman with longstanding tobacco abuse, peripheral vascular disease and carotid vascular disease who presented with sob and altered mentation and was found to be acidemic and hypercardia. She has been given IV diuretic therapy and placed on bipap. She remains somnolent and does not answer questions on my exam. Her daughter who provides the history states she has been sleepy during the day, will fall asleep when she eats, and snores loudly at night. She is sedentary.   PMH: Past Medical History:  Diagnosis Date  . Carotid artery occlusion   . History of dyspnea   . Peripheral vascular disease (HCC)   . SOB (shortness of breath)   . Tricuspid regurgitation     PSHX: Past Surgical History:  Procedure Laterality Date  . APPENDECTOMY  1964    FAMHX:History reviewed. No pertinent family history.  Social History:  reports that she has been smoking Cigarettes.  She has never used smokeless tobacco. She reports that she drinks alcohol. She reports that she does not use drugs.  Allergies: No Known Allergies  Medications: I have reviewed the patient's current medications.  Ct Angio Chest Pe W/cm &/or Wo Cm  Result Date: 10/07/2015 CLINICAL DATA:  Bilateral leg swelling with chest pain EXAM: CT ANGIOGRAPHY CHEST WITH CONTRAST TECHNIQUE: Multidetector CT imaging of the chest was performed using the standard protocol during bolus administration of intravenous contrast. Multiplanar CT image reconstructions and MIPs were obtained to evaluate the vascular anatomy. CONTRAST:  85 mL Isovue 370 COMPARISON:  None. FINDINGS: Mediastinum/Lymph Nodes: No significant hilar or mediastinal adenopathy is identified. Scattered small mediastinal nodes are seen. The esophagus as visualized is within normal limits. Scattered small axillary  lymph nodes are noted bilaterally with normal fatty hila. Cardiovascular: Mild aortic calcifications are noted without aneurysmal dilatation or dissection. The pulmonary artery shows a normal branching pattern without evidence of pulmonary embolism. Lungs/Pleura: Dependent atelectatic changes are noted right greater than left. No focal confluent infiltrate is seen. No sizable effusion is noted. A parenchymal nodule is noted within the right upper lobe measuring approximately 7 mm. No other parenchymal nodules are seen. Upper abdomen: No acute findings. Musculoskeletal: No chest wall mass or suspicious bone lesions identified. Review of the MIP images confirms the above findings. IMPRESSION: Mild dependent atelectatic changes bilaterally. 7 mm right upper lobe nodule laterally. Non-contrast chest CT at 6-12 months is recommended. If the nodule is stable at time of repeat CT, then future CT at 18-24 months (from today's scan) is considered optional for low-risk patients, but is recommended for high-risk patients. This recommendation follows the consensus statement: Guidelines for Management of Incidental Pulmonary Nodules Detected on CT Images:From the Fleischner Society 2017; published online before print (10.1148/radiol.4098119147). No evidence of pulmonary emboli. Electronically Signed   By: Alcide Clever M.D.   On: 10/07/2015 21:48  Dg Chest Port 1 View  Result Date: 10/07/2015 CLINICAL DATA:  Shortness of breath EXAM: PORTABLE CHEST 1 VIEW COMPARISON:  11/12/2014 FINDINGS: Cardiac shadow is enlarged. The lungs are well aerated bilaterally. Increased vascular congestion with mild interstitial edema is noted. No bony abnormality is seen. IMPRESSION: Changes of mild CHF. Electronically Signed   By: Alcide Clever M.D.   On: 10/07/2015 20:42   ROS  As stated in the HPI and negative for all other systems.  Physical  Exam  Vitals:Blood pressure (!) 128/56, pulse 80, temperature 98.7 F (37.1 C), temperature  source Axillary, resp. rate (!) 28, height  (1.499 m), weight 242 lb 11.2 oz (110.1 kg), SpO2 95 %.  Ill appearing obese 65 yo woman, wearing bipap HEENT: Unremarkable Neck:  7 cm JVD, no thyromegally Lymphatics:  No adenopathy Back:  No CVA tenderness Lungs:  Scattered rales, decreased breath sounds HEART:  Regular rate rhythm, no murmurs, no rubs, no clicks Abd:  Soft, obese, positive bowel sounds, no organomegally, no rebound, no guarding Ext:  2 plus pulses, trace peripheral edema, no cyanosis, no clubbing Skin:  No rashes no nodules Neuro:  Somnolent  ECG - NSR with no STT changes  Tele - NSR  CXR - reviewed    Assessment/Plan: 1. Chest pain - unable to get a history. Enzymes are essentially negative, her problem is not due to coronary ischemia at this point although she may have secondary ischemia due to hypoxemia 2. VDRF - she has hypercarbic failure. Her history is clearly obesity hypoventilation. I would suggest weaning the oxygen concentration on her bipap down as low as possible to keep her oxygen sat in the high 80's/low 90's. She depends on her hypoexemia to drive respirations.  3. Pulmonary vascular congestion - she has normal LV function. She could have a component of diastolic heart failure. Agree with gentile diuresis though you could make her bicarb increase further 4. Obesity hypoventilation -consider asking pulmonary critical care to see patient 5. Ongoing tobacco abuse - I discussed the importance of smoking cessation to her daughter. Her son takes her to buy cigarettes.  Frances [name] TaylorMD 10/08/2015, 4:58 PM

## 2015-10-08 NOTE — Progress Notes (Addendum)
PROGRESS NOTE  Frances [name] ZOX:[id number] DOB: 01-11-1951 DOA: 10/07/2015 PCP: Bernerd Limbo   LOS: 1 day   Brief Narrative: 65 year old female with a 50 year smoking history was been having chest pains on and off for approximately 4-5 months. She has not been to see a PCP. She has had chest pains on and off for the last 4-5 months, throat pain and lower extremity swelling. She is also had shortness of breath for the same length of time. Yesterday all symptoms got worse.  Assessment & Plan: Active Problems:   SOB (shortness of breath)   Lower extremity edema   Syncope   Tobacco abuse   Chest pain  Acute on chronic hypoxic and hypercarbic respiratory failure with partially compensated respiratory acidosis - Will need sleep study as an outpatient - ABG done this morning shows pH of 7.29, PCO2 of 78 and PO2 of 57 - Suspect she has a chronic component of hypercarbia given elevated bicarbonate - This is likely multifactorial due to underlying COPD, obesity hypoventilation and most likely she has obstructive sleep apnea, in addition a component of pulmonary edema due to acute on chronic diastolic heart failure - Placed on BiPAP, transfer to stepdown, repeat ABG in 6 hours  Acute encephalopathy - Patient more lethargic this morning, falling asleep during interview, likely due to hypercarbia, will monitor on BiPAP  Acute on chronic probable diastolic heart failure - Most recent 2-D echo was done in September 2016, had normal EF - Chest x-ray with fluid overload, continue IV Lasix  Chest pain - Patient with several months history of intermittent chest pressure, cardiac enzymes flat and not in a pattern consistent with ACS - EKG nonischemic - cardiology consulted for evaluation for medical management, possible stress testing - Repeat 2-D echo pending - Chest pain-free today  Syncope - Admit to med telemetry, likely due to hypoxia  Tobacco abuse - Nicotine patch, duonebs as  needed  Possible COPD exacerbation - Likely due to COPD with chronic hypercapnia. - Continue steroids  Pulmonary nodule - In a patient who smokes. See guidelines  Morbid obesity - Discussed with daughter bedside, recommended weight loss   DVT prophylaxis: Lovenox Code Status: Full code Family Communication: Discussed with daughter at bedside Disposition Plan: Transfer to stepdown  Consultants:   Cardiology  Procedures:   2D echo: Pending  BiPAP: Yes  Antimicrobials:  None    Subjective: - She has no chest pain, she appears somewhat sleepy and drifts to sleep while talking to me  Objective: Vitals:   10/08/15 0000 10/08/15 0030 10/08/15 0115 10/08/15 0556  BP: (!) 114/46 (!) 121/49 111/68 (!) 154/65  Pulse: 77 75 79 87  Resp: Temp:   99 F (37.2 C) 97.8 F (36.6 C)  TempSrc:   Oral Axillary  SpO2: 91% 90% 96%   Weight:   110.1 kg (242 lb 11.2 oz)   Height:    (1.499 m)     Intake/Output Summary (Last 24 hours) at 10/08/15 1152 Last data filed at 10/08/15 1000  Gross per 24 hour  Intake              240 ml  Output                0 ml  Net              240 ml   Filed Weights   10/07/15 2002 10/08/15 0115  Weight: 110.2 kg (243 lb) 110.1 kg (  242 lb 11.2 oz)    Examination: Constitutional: NAD Vitals:   10/08/15 0000 10/08/15 0030 10/08/15 0115 10/08/15 0556  BP: (!) 114/46 (!) 121/49 111/68 (!) 154/65  Pulse: 77 75 79 87  Resp: 18 23 16    Temp:   99 F (37.2 C) 97.8 F (36.6 C)  TempSrc:   Oral Axillary  SpO2: 91% 90% 96%   Weight:   110.1 kg (242 lb 11.2 oz)   Height:   4\' 11"  (1.499 m)    Eyes: PERRL, lids and conjunctivae normal ENMT: Mucous membranes are moist. Respiratory: No wheezing appreciated, faint bibasilar crackles Cardiovascular: Regular rate and rhythm, no murmurs / rubs / gallops. 1+ LE edema Abdomen: no tenderness. Bowel sounds positive.  Musculoskeletal: no clubbing / cyanosis. Skin: no rashes, lesions,  ulcers.  Neurologic: Nonfocal   Data Reviewed: I have personally reviewed following labs and imaging studies  CBC:  Recent Labs Lab 10/07/15 2031 10/07/15 2040 10/08/15 0140  WBC 10.2  --  10.4  NEUTROABS 6.7  --   --   HGB 14.4 16.0* 14.4  HCT 46.0 47.0* 48.3*  MCV 90.4  --  91.3  PLT 186  --  204   Basic Metabolic Panel:  Recent Labs Lab 10/07/15 2031 10/07/15 2040 10/08/15 0140  NA 137 139 138  K 3.8 3.9 4.4  CL 97* 94* 97*  CO2 34*  --  33*  GLUCOSE 110* 106* 134*  BUN 14 14 13   CREATININE 0.43* 0.70 0.54  CALCIUM 8.9  --  8.9   GFR: Estimated Creatinine Clearance: 77.5 mL/min (by C-G formula based on SCr of 0.8 mg/dL). Liver Function Tests:  Recent Labs Lab 10/07/15 2031  AST 12*  ALT 12*  ALKPHOS 70  BILITOT 0.8  PROT 7.3  ALBUMIN 3.4*   No results for input(s): LIPASE, AMYLASE in the last 168 hours. No results for input(s): AMMONIA in the last 168 hours. Coagulation Profile:  Recent Labs Lab 10/07/15 2031  INR 1.00   Cardiac Enzymes:  Recent Labs Lab 10/08/15 0140 10/08/15 0657  TROPONINI 0.05* 0.05*   BNP (last 3 results) No results for input(s): PROBNP in the last 8760 hours. HbA1C: No results for input(s): HGBA1C in the last 72 hours. CBG: No results for input(s): GLUCAP in the last 168 hours. Lipid Profile:  Recent Labs  10/08/15 0140  CHOL 201*  HDL 35*  LDLCALC 144*  TRIG 110  CHOLHDL 5.7   Thyroid Function Tests:  Recent Labs  10/08/15 0140  TSH 0.997   Anemia Panel: No results for input(s): VITAMINB12, FOLATE, FERRITIN, TIBC, IRON, RETICCTPCT in the last 72 hours. Urine analysis: No results found for: COLORURINE, APPEARANCEUR, LABSPEC, PHURINE, GLUCOSEU, HGBUR, BILIRUBINUR, KETONESUR, PROTEINUR, UROBILINOGEN, NITRITE, LEUKOCYTESUR Sepsis Labs: Invalid input(s): PROCALCITONIN, LACTICIDVEN  No results found for this or any previous visit (from the past 240 hour(s)).    Radiology Studies: Ct Angio Chest  Pe W/cm &/or Wo Cm  Result Date: 10/07/2015 CLINICAL DATA:  Bilateral leg swelling with chest pain EXAM: CT ANGIOGRAPHY CHEST WITH CONTRAST TECHNIQUE: Multidetector CT imaging of the chest was performed using the standard protocol during bolus administration of intravenous contrast. Multiplanar CT image reconstructions and MIPs were obtained to evaluate the vascular anatomy. CONTRAST:  85 mL Isovue 370 COMPARISON:  None. FINDINGS: Mediastinum/Lymph Nodes: No significant hilar or mediastinal adenopathy is identified. Scattered small mediastinal nodes are seen. The esophagus as visualized is within normal limits. Scattered small axillary lymph nodes are noted bilaterally with  normal fatty hila. Cardiovascular: Mild aortic calcifications are noted without aneurysmal dilatation or dissection. The pulmonary artery shows a normal branching pattern without evidence of pulmonary embolism. Lungs/Pleura: Dependent atelectatic changes are noted right greater than left. No focal confluent infiltrate is seen. No sizable effusion is noted. A parenchymal nodule is noted within the right upper lobe measuring approximately 7 mm. No other parenchymal nodules are seen. Upper abdomen: No acute findings. Musculoskeletal: No chest wall mass or suspicious bone lesions identified. Review of the MIP images confirms the above findings. IMPRESSION: Mild dependent atelectatic changes bilaterally. 7 mm right upper lobe nodule laterally. Non-contrast chest CT at 6-12 months is recommended. If the nodule is stable at time of repeat CT, then future CT at 18-24 months (from today's scan) is considered optional for low-risk patients, but is recommended for high-risk patients. This recommendation follows the consensus statement: Guidelines for Management of Incidental Pulmonary Nodules Detected on CT Images:From the Fleischner Society 2017; published online before print (10.1148/radiol.1610960454). No evidence of pulmonary emboli. Electronically  Signed   By: Alcide Clever M.D.   On: 10/07/2015 21:48  Dg Chest Port 1 View  Result Date: 10/07/2015 CLINICAL DATA:  Shortness of breath EXAM: PORTABLE CHEST 1 VIEW COMPARISON:  11/12/2014 FINDINGS: Cardiac shadow is enlarged. The lungs are well aerated bilaterally. Increased vascular congestion with mild interstitial edema is noted. No bony abnormality is seen. IMPRESSION: Changes of mild CHF. Electronically Signed   By: Alcide Clever M.D.   On: 10/07/2015 20:42    Scheduled Meds: . aspirin EC  81 mg Oral Daily  . enoxaparin (LOVENOX) injection  55 mg Subcutaneous Q24H  . furosemide  20 mg Intravenous Q12H  . methylPREDNISolone (SOLU-MEDROL) injection  60 mg Intravenous Q8H  . nicotine  14 mg Transdermal Daily  . nitroGLYCERIN  0.5 inch Topical Q8H  . sodium chloride flush  3 mL Intravenous Q12H   Continuous Infusions:   Pamella Pert, MD, PhD [facility] Pager [phone]  If 7PM-7AM, please contact night-coverage [email or web address] Password [email or web address] 10/08/2015, 11:52 AM

## 2015-10-09 DIAGNOSIS — J9621 Acute and chronic respiratory failure with hypoxia: Secondary | ICD-10-CM

## 2015-10-09 DIAGNOSIS — Z720 Tobacco use: Secondary | ICD-10-CM

## 2015-10-09 DIAGNOSIS — E662 Morbid (severe) obesity with alveolar hypoventilation: Secondary | ICD-10-CM

## 2015-10-09 DIAGNOSIS — G4733 Obstructive sleep apnea (adult) (pediatric): Secondary | ICD-10-CM

## 2015-10-09 DIAGNOSIS — J410 Simple chronic bronchitis: Secondary | ICD-10-CM

## 2015-10-09 DIAGNOSIS — J9622 Acute and chronic respiratory failure with hypercapnia: Secondary | ICD-10-CM

## 2015-10-09 DIAGNOSIS — R911 Solitary pulmonary nodule: Secondary | ICD-10-CM

## 2015-10-09 LAB — BLOOD GAS, ARTERIAL
ACID-BASE EXCESS: 11.9 mmol/L — AB (ref 0.0–2.0)
BICARBONATE: 38.7 meq/L — AB (ref 20.0–24.0)
Delivery systems: POSITIVE
Drawn by: 441371
EXPIRATORY PAP: 6
FIO2: 0.3
INSPIRATORY PAP: 14
LHR: 10 {breaths}/min
Mode: POSITIVE
O2 SAT: 91.6 %
PATIENT TEMPERATURE: 98.7
PCO2 ART: 59.2 mmHg — AB (ref 35.0–45.0)
PH ART: 7.43 (ref 7.350–7.450)
TCO2: 33.8 mmol/L (ref 0–100)
pO2, Arterial: 63.6 mmHg — ABNORMAL LOW (ref 80.0–100.0)

## 2015-10-09 LAB — BASIC METABOLIC PANEL
Anion gap: 11 (ref 5–15)
BUN: 21 mg/dL — AB (ref 6–20)
CALCIUM: 9.1 mg/dL (ref 8.9–10.3)
CO2: 32 mmol/L (ref 22–32)
Chloride: 93 mmol/L — ABNORMAL LOW (ref 101–111)
Creatinine, Ser: 0.76 mg/dL (ref 0.44–1.00)
GFR calc Af Amer: >60 mL/min (ref >60)
GFR calc non Af Amer: >60 mL/min (ref >60)
GLUCOSE: 320 mg/dL — AB (ref 65–99)
Potassium: 4.2 mmol/L (ref 3.5–5.1)
Sodium: 136 mmol/L (ref 135–145)

## 2015-10-09 LAB — HEMOGLOBIN A1C
HEMOGLOBIN A1C: 6.2 % — AB (ref 4.8–5.6)
Mean Plasma Glucose: 131 mg/dL

## 2015-10-09 MED ORDER — HYDRALAZINE HCL 20 MG/ML IJ SOLN
10.0000 mg | INTRAMUSCULAR | Status: DC | PRN
  Administered 2015-10-10: 10 mg via INTRAVENOUS
  Filled 2015-10-09: qty 1

## 2015-10-09 MED ORDER — LABETALOL HCL 5 MG/ML IV SOLN
10.0000 mg | Freq: Once | INTRAVENOUS | Status: AC
  Administered 2015-10-09: 10 mg via INTRAVENOUS
  Filled 2015-10-09: qty 4

## 2015-10-09 MED ORDER — LORAZEPAM 2 MG/ML IJ SOLN
0.5000 mg | Freq: Once | INTRAMUSCULAR | Status: AC
  Administered 2015-10-09: 0.5 mg via INTRAVENOUS
  Filled 2015-10-09: qty 1

## 2015-10-09 MED ORDER — PREDNISONE 20 MG PO TABS
20.0000 mg | ORAL_TABLET | Freq: Every day | ORAL | Status: DC
  Administered 2015-10-09 – 2015-10-11 (×3): 20 mg via ORAL
  Filled 2015-10-09 (×3): qty 1

## 2015-10-09 NOTE — Progress Notes (Signed)
RT took pt off bipap per MD request following ABG results. Pt is comfortable at this time, family in room.

## 2015-10-09 NOTE — Progress Notes (Signed)
CRITICAL VALUE ALERT  Critical value received:  PH 7.431, pCO2 59.1, pO2 63.4  Date of notification:  10-09-15  Time of notification:  0737  Critical value read back:Yes.    Nurse who received alert:  Marijo Sanes  MD notified (1st page):  Gherghe  Time of first page:  On floor  MD notified (2nd page):  Time of second page:  Responding MD:  Lafe Garin  Time MD responded:  [phone]

## 2015-10-09 NOTE — Progress Notes (Signed)
Pt Profile:  65 yo woman with longstanding tobacco abuse, peripheral vascular disease and carotid vascular disease who presented with sob and altered mentation and was found to be acidemic and hypercardia. She has been given IV diuretic therapy and placed on bipap. She remains somnolent and does not answer questions on my exam. Her daughter who provides the history states she has been sleepy during the day, will fall asleep when she eats, and snores loudly at night. She is sedentary.   We were asked to see for troponin 0.03-->0.05-->0.05-->0.04 flat and essentially normal. echo was done.  Subjective: No complaints except for some back pain, back pain chronic now increased with being in bed, breathing much better, daughter translates.   Objective: Vital signs in last 24 hours: Temp:  [97.8 F (36.6 C)-98.8 F (37.1 C)] 97.8 F (36.6 C) (07/31 0345) Pulse Rate:  [65-84] 73 (07/31 0600) Resp:  [14-28] 24 (07/31 0600) BP: (128-179)/(55-84) 170/75 (07/31 0600) SpO2:  [89 %-97 %] 92 % (07/31 0600) FiO2 (%):  [30 %] 30 % (07/31 0000) Weight:  [242 lb 4.6 oz (109.9 kg)] 242 lb 4.6 oz (109.9 kg) (07/31 0526) Weight change: -11.4 oz (-0.324 kg) Last BM Date: 10/07/15 Intake/Output from previous day: voided at least 5 times. Unsure how accurate I&O is.  07/30 0701 - 07/31 0700 In: 240 [P.O.:240] Out: 450 [Urine:450] Intake/Output this shift: No intake/output data recorded.  ZO:XWRUEAV:WUJWJXBJ affect, NAD, sitting up in bed. Skin:Warm and dry, brisk capillary refill HEENT:normocephalic, sclera clear, mucus membranes moist Neck:supple, no JVD-sitting up in bed. Heart:S1S2 RRR without murmur, gallup, rub or click Lungs:clear to diminished without rales, rhonchi, or wheezes YNW:GNFAO, soft, non tender, + BS, do not palpate liver spleen or masses Ext:no lower ext edema, 2+ pedal pulses, 2+ radial pulses Neuro:alert and oriented, MAE, follows commands, + facial symmetry Tele:  SR    Lab Results:  Recent Labs  10/07/15 2031 10/07/15 2040 10/08/15 0140  WBC 10.2  --  10.4  HGB 14.4 16.0* 14.4  HCT 46.0 47.0* 48.3*  PLT 186  --  204   BMET  Recent Labs  10/07/15 2031 10/07/15 2040 10/08/15 0140  NA 137 139 138  K 3.8 3.9 4.4  CL 97* 94* 97*  CO2 34*  --  33*  GLUCOSE 110* 106* 134*  BUN 14 14 13   CREATININE 0.43* 0.70 0.54  CALCIUM 8.9  --  8.9    Recent Labs  10/08/15 0657 10/08/15 1303  TROPONINI 0.05* 0.04*    Lab Results  Component Value Date   CHOL 201 (H) 10/08/2015   HDL 35 (L) 10/08/2015   LDLCALC 144 (H) 10/08/2015   TRIG 110 10/08/2015   CHOLHDL 5.7 10/08/2015   Lab Results  Component Value Date   HGBA1C 6.2 (H) 10/08/2015     Lab Results  Component Value Date   TSH 0.997 10/08/2015    Hepatic Function Panel  Recent Labs  10/07/15 2031  PROT 7.3  ALBUMIN 3.4*  AST 12*  ALT 12*  ALKPHOS 70  BILITOT 0.8    Recent Labs  10/08/15 0140  CHOL 201*   No results for input(s): PROTIME in the last 72 hours.     Studies/Results: Ct Angio Chest Pe W/cm &/or Wo Cm  Result Date: 10/07/2015 CLINICAL DATA:  Bilateral leg swelling with chest pain EXAM: CT ANGIOGRAPHY CHEST WITH CONTRAST TECHNIQUE: Multidetector CT imaging of the chest was performed using the standard protocol during bolus administration  of intravenous contrast. Multiplanar CT image reconstructions and MIPs were obtained to evaluate the vascular anatomy. CONTRAST:  85 mL Isovue 370 COMPARISON:  None. FINDINGS: Mediastinum/Lymph Nodes: No significant hilar or mediastinal adenopathy is identified. Scattered small mediastinal nodes are seen. The esophagus as visualized is within normal limits. Scattered small axillary lymph nodes are noted bilaterally with normal fatty hila. Cardiovascular: Mild aortic calcifications are noted without aneurysmal dilatation or dissection. The pulmonary artery shows a normal branching pattern without evidence of pulmonary  embolism. Lungs/Pleura: Dependent atelectatic changes are noted right greater than left. No focal confluent infiltrate is seen. No sizable effusion is noted. A parenchymal nodule is noted within the right upper lobe measuring approximately 7 mm. No other parenchymal nodules are seen. Upper abdomen: No acute findings. Musculoskeletal: No chest wall mass or suspicious bone lesions identified. Review of the MIP images confirms the above findings. IMPRESSION: Mild dependent atelectatic changes bilaterally. 7 mm right upper lobe nodule laterally. Non-contrast chest CT at 6-12 months is recommended. If the nodule is stable at time of repeat CT, then future CT at 18-24 months (from today's scan) is considered optional for low-risk patients, but is recommended for high-risk patients. This recommendation follows the consensus statement: Guidelines for Management of Incidental Pulmonary Nodules Detected on CT Images:From the Fleischner Society 2017; published online before print (10.1148/radiol.5188416606). No evidence of pulmonary emboli. Electronically Signed   By: Alcide Clever M.D.   On: 10/07/2015 21:48  Dg Chest Port 1 View  Result Date: 10/07/2015 CLINICAL DATA:  Shortness of breath EXAM: PORTABLE CHEST 1 VIEW COMPARISON:  11/12/2014 FINDINGS: Cardiac shadow is enlarged. The lungs are well aerated bilaterally. Increased vascular congestion with mild interstitial edema is noted. No bony abnormality is seen. IMPRESSION: Changes of mild CHF. Electronically Signed   By: Alcide Clever M.D.   On: 10/07/2015 20:42  ECHO 10/08/15: Study Conclusions  - Left ventricle: The cavity size was normal. There was mild   concentric hypertrophy. Systolic function was vigorous. The   estimated ejection fraction was in the range of 65% to 70%. Wall   motion was normal; there were no regional wall motion   abnormalities. Doppler parameters are consistent with abnormal   left ventricular relaxation (grade 1 diastolic  dysfunction).   Doppler parameters are consistent with high ventricular filling   pressure. - Aortic valve: Transvalvular velocity was within the normal range.   There was no stenosis. There was no regurgitation. - Mitral valve: Transvalvular velocity was within the normal range.   There was no evidence for stenosis. There was trivial   regurgitation. - Left atrium: The atrium was mildly dilated. - Right ventricle: The cavity size was normal. Wall thickness was   normal. Systolic function was normal. - Tricuspid valve: There was trivial regurgitation.   Medications: I have reviewed the patient's current medications. Scheduled Meds: . antiseptic oral rinse  7 mL Mouth Rinse q12n4p  . aspirin EC  81 mg Oral Daily  . chlorhexidine  15 mL Mouth Rinse BID  . enoxaparin (LOVENOX) injection  55 mg Subcutaneous Q24H  . methylPREDNISolone (SOLU-MEDROL) injection  60 mg Intravenous Q8H  . nicotine  14 mg Transdermal Daily  . nitroGLYCERIN  0.5 inch Topical Q8H  . sodium chloride flush  3 mL Intravenous Q12H   Continuous Infusions:  PRN Meds:.acetaminophen **OR** acetaminophen, hydrALAZINE, ipratropium-albuterol, ondansetron **OR** ondansetron (ZOFRAN) IV, senna-docusate  Assessment/Plan: Active Problems:   SOB (shortness of breath)   Lower extremity edema   Syncope  Tobacco abuse   Chest pain  Minimally abnormal troponins that are flat, is setting of SOB and obesity hypoventilation.   - further recommendations per Dr. Jens Som.  Ongoing tobacco use.  Has on nicoderm patch  Acute respiratory failure was on bipap, CCM consult recommended.  - now off bipap  Pulmonary nodule on CTA of chest.   per IM   HTN continues to be elevated. Would stop NTG paste and add  ACE or ARB with DM   Echo with G1DD    LOS: 2 days   Time spent with pt. :20 minutes. Nada Boozer  Nurse Practitioner Certified Pager [phone] or after 5pm and on weekends call [phone] 10/09/2015, 8:22 AM  As  above, patient seen and examined. Patient is from [place] and does not speak [place]. History is obtained with the assistance of her daughter. Patient has had chest pain for approximately 4 months but it has been continuous and increases with cough. She also states her dyspnea is improving. Her electrocardiogram shows sinus rhythm with nonspecific ST changes.   Her enzymes are not consistent with an acute coronary syndrome. Would not pursue further ischemia evaluation. CP may be muskuloskeletal. Can continue Lasix 20 mg daily after discharge with close follow-up of her renal function and potassium with primary care. Her blood pressure is elevated and would consider an ACE inhibitor or amlodipine. Discontinue tobacco use. Given documented vascular disease would treat with a statin long-term. We will sign off. She needs close follow-up with her primary care physician. Please call with questions.  Olga Millers, MD

## 2015-10-09 NOTE — Consult Note (Signed)
PULMONARY / CRITICAL CARE MEDICINE   Name: Frances [name] MRN: [id number] DOB: 02-07-1951    ADMISSION DATE:  10/07/2015 CONSULTATION DATE:  10/09/2015  REFERRING MD:  Dr. Wyonia Hough, [facility]  CHIEF COMPLAINT:  Short of breath  HISTORY OF PRESENT ILLNESS:   Hx from translator.  65 yo female presented with chest pain and progressive leg swelling.  She was also noted to be short of breath and progressive somnolence.  She had persistent cough.  She was noted to have SpO2 of 75% in ER.  She has hx of smoking and concern for COPD exacerbation.  ABG showed hypoxia and hypercapnia.  She was started on BiPAP.  Daughter states pt snores and will stop breathing while asleep.  She would get sleepy and home and fall asleep during the day.  She has cough with clear sputum and intermittent wheezing.  She smokes about 1/2 pack per day.  Her chest pain and leg swelling have improved since being in hospital.  She feels oxygen and BiPAP have helped.    She is from [place] originally, but now lives in [place].  No history of pneumonia.  She had negative quantiferon gold from 10/03/14.  PAST MEDICAL HISTORY :  She  has a past medical history of Carotid artery occlusion; History of dyspnea; Peripheral vascular disease (HCC); SOB (shortness of breath); and Tricuspid regurgitation.  PAST SURGICAL HISTORY: She  has a past surgical history that includes Appendectomy (1964).  No Known Allergies  No current facility-administered medications on file prior to encounter.    No current outpatient prescriptions on file prior to encounter.    FAMILY HISTORY:  Her family history is positive for snoring.  SOCIAL HISTORY: She  reports that she has been smoking Cigarettes.  She has never used smokeless tobacco. She reports that she drinks alcohol. She reports that she does not use drugs.  REVIEW OF SYSTEMS:   Negative except above.  SUBJECTIVE:  Still has cough.  VITAL SIGNS: BP (!) 170/75   Pulse 73   Temp 97.9  F (36.6 C) (Axillary)   Resp (!) 24   Ht  (1.499 m)   Wt 242 lb 4.6 oz (109.9 kg)   SpO2 92%   BMI 48.94 kg/m   INTAKE / OUTPUT: I/O last 3 completed shifts: In: 240 [P.O.:240] Out: 450 [Urine:450]  PHYSICAL EXAMINATION: General:  Sitting up in bed Neuro:  Alert, normal strength, CN intact HEENT:  No sinus tenderness, pupils reactive, MP 3, no stridor, no LAN Cardiovascular:  Regular, no murmur Lungs:  No wheeze/rales Abdomen:  Soft, non tender Musculoskeletal:  1+ edema Skin:  No rashes  LABS:  BMET  Recent Labs Lab 10/07/15 2031 10/07/15 2040 10/08/15 0140  NA 137 139 138  K 3.8 3.9 4.4  CL 97* 94* 97*  CO2 34*  --  33*  BUN CREATININE 0.43* 0.70 0.54  GLUCOSE 110* 106* 134*    Electrolytes  Recent Labs Lab 10/07/15 2031 10/08/15 0140  CALCIUM 8.9 8.9    CBC  Recent Labs Lab 10/07/15 2031 10/07/15 2040 10/08/15 0140  WBC 10.2  --  10.4  HGB 14.4 16.0* 14.4  HCT 46.0 47.0* 48.3*  PLT 186  --  204    Coag's  Recent Labs Lab 10/07/15 2031  APTT 30  INR 1.00    Sepsis Markers  Recent Labs Lab 10/07/15 2041  LATICACIDVEN 0.77    ABG  Recent Labs Lab 10/08/15 1121 10/08/15 1758 10/09/15 0725  PHART 7.290* 7.330* 7.430  PCO2ART 78.3* 75.9* 59.2*  PO2ART 57.7* 56.2* 63.6*    Liver Enzymes  Recent Labs Lab 10/07/15 2031  AST 12*  ALT 12*  ALKPHOS 70  BILITOT 0.8  ALBUMIN 3.4*    Cardiac Enzymes  Recent Labs Lab 10/08/15 0140 10/08/15 0657 10/08/15 1303  TROPONINI 0.05* 0.05* 0.04*    Lab Results  Component Value Date   TSH 0.997 10/08/2015     Glucose No results for input(s): GLUCAP in the last 168 hours.  Imaging No results found.   STUDIES:  7/29 CT angio chest >> ATX, 7 mm RUL nodule 7/30 Echo >> EF 65 to 70%, grade 1 diastolic CHF, mild LA dilation  CULTURES:  ANTIBIOTICS:  SIGNIFICANT EVENTS: 7/29 Admit 7/30 Start on BiPAP  LINES/TUBES:  DISCUSSION: 65 yo  female smoker with morbid obesity presents with progressive dyspnea, cough, chest pain, and leg edema.  She has acute on chronic hypoxic/hypercapnic respiratory failure likely from OSA/OHS and possible COPD.  Also found to have 7 mm Rt upper lung nodule.  ASSESSMENT / PLAN:  Acute on chronic hypoxic/hypercapnic respiratory failure from OSA/OHS, ATX, CHF, and possible COPD. - oxygen to keep SpO2 90 to 95% - will need to assess for home oxygen prior to discharge  Morbid obesity with presumed OSA/OHS. - will try transitioning to CPAP qhs >> this will be easier to set up initially as outpt while awaiting sleep study - weight loss - will need outpt sleep study >> she will need to have in lab sleep study since she has Medicaid  Tobacco abuse with productive cough and concern for COPD. - change from solumedrol to prednisone 20 mg and wean off as tolerated - prn BDs - nicotine patch, smoking cessation  Rt upper lung nodule. - will need f/u CT chest w/o contrast in February 2018  Chest pain. Acute on chronic diastolic CHF. - per primary team and cardiology  Acute metabolic encephalopathy likely related to hypercapnia >> much improved. - per primary team  I have scheduled her for pulmonary office follow up with Tammy Parrett on Tuesday, August 15 at 4 pm.  Updated pt's daughter at bedside.  Coralyn Helling, MD [facility] Pulmonary/Critical Care 10/09/2015, 10:11 AM Pager:  [phone] After 3pm call: [phone]

## 2015-10-09 NOTE — Progress Notes (Signed)
PROGRESS NOTE  Frances [name] AVW:[id number] DOB: December 27, 1950 DOA: 10/07/2015 PCP: Bernerd Limbo   LOS: 2 days   Brief Narrative: 65 year old female with a 50 year smoking history was been having chest pains on and off for approximately 4-5 months. She has not been to see a PCP. She has had chest pains on and off for the last 4-5 months, throat pain and lower extremity swelling. She is also had shortness of breath for the same length of time. Yesterday all symptoms got worse.  Assessment & Plan: Active Problems:   SOB (shortness of breath)   Lower extremity edema   Syncope   Tobacco abuse   Chest pain  Acute on chronic hypoxic and hypercarbic respiratory failure with partially compensated respiratory acidosis - Will need sleep study as an outpatient - ABG done this morning shows improvement, discontinue the BiPAP - Pulmonology consulted, appreciate input - This is likely multifactorial due to underlying COPD, obesity hypoventilation and most likely she has obstructive sleep apnea, in addition a component of pulmonary edema due to acute on chronic diastolic heart failure  Acute encephalopathy - Resolved  Acute on chronic probable diastolic heart failure - Most recent 2-D echo was done in September 2016, had normal EF - Chest x-ray with fluid overload, continue IV Lasix - Morning BMP pending  Chest pain - Patient with several months history of intermittent chest pressure, cardiac enzymes flat and not in a pattern consistent with ACS - EKG nonischemic - cardiology consulted for evaluation for medical management, recommended continue Lasix, no ischemic workup at this point  Tobacco abuse - Nicotine patch, duonebs as needed  Possible COPD exacerbation - Likely due to COPD with chronic hypercapnia. - Continue steroids, change to prednisone  Pulmonary nodule - In a patient who smokes. See guidelines  Morbid obesity - Discussed with daughter bedside, recommended weight  loss    DVT prophylaxis: Lovenox Code Status: Full code Family Communication: Discussed with daughter at bedside Disposition Plan: Transfer to stepdown  Consultants:   Cardiology  Pulmonology  Procedures:   2D echo:  Study Conclusions - Left ventricle: The cavity size was normal. There was mild concentric hypertrophy. Systolic function was vigorous. The estimated ejection fraction was in the range of 65% to 70%. Wall motion was normal; there were no regional wall motion abnormalities. Doppler parameters are consistent with abnormal left ventricular relaxation (grade 1 diastolic dysfunction). Doppler parameters are consistent with high ventricular filling pressure. - Aortic valve: Transvalvular velocity was within the normal range. There was no stenosis. There was no regurgitation. - Mitral valve: Transvalvular velocity was within the normal range. There was no evidence for stenosis. There was trivial   regurgitation. - Left atrium: The atrium was mildly dilated. - Right ventricle: The cavity size was normal. Wall thickness was normal. Systolic function was normal. - Tricuspid valve: There was trivial regurgitation.  Antimicrobials:  None    Subjective: - much more alert, no complaints  Objective: Vitals:   10/09/15 0600 10/09/15 0716 10/09/15 0800 10/09/15 0901  BP: (!) 170/75 (!) 174/81    Pulse: 73 61  74  Resp: (!) 24 (!) 26  (!) 25  Temp:   97.9 F (36.6 C)   TempSrc:   Axillary   SpO2: 92% 95%  96%  Weight:      Height:        Intake/Output Summary (Last 24 hours) at 10/09/15 1107 Last data filed at 10/08/15 2000  Gross per 24 hour  Intake  0 ml  Output              450 ml  Net             -450 ml   Filed Weights   10/07/15 2002 10/08/15 0115 10/09/15 0526  Weight: 110.2 kg (243 lb) 110.1 kg (242 lb 11.2 oz) 109.9 kg (242 lb 4.6 oz)    Examination: Constitutional: NAD Vitals:   10/09/15 0600 10/09/15 0716 10/09/15 0800 10/09/15 0901   BP: (!) 170/75 (!) 174/81    Pulse: 73 61  74  Resp: (!) 24 (!) 26  (!) 25  Temp:   97.9 F (36.6 C)   TempSrc:   Axillary   SpO2: 92% 95%  96%  Weight:      Height:       Eyes: PERRL, lids and conjunctivae normal ENMT: Mucous membranes are moist. Respiratory: No wheezing appreciated, faint bibasilar crackles Cardiovascular: Regular rate and rhythm, no murmurs / rubs / gallops. Trace LE edema Abdomen: no tenderness. Bowel sounds positive.  Musculoskeletal: no clubbing / cyanosis. Skin: no rashes, lesions, ulcers.  Neurologic: Nonfocal   Data Reviewed: I have personally reviewed following labs and imaging studies  CBC:  Recent Labs Lab 10/07/15 2031 10/07/15 2040 10/08/15 0140  WBC 10.2  --  10.4  NEUTROABS 6.7  --   --   HGB 14.4 16.0* 14.4  HCT 46.0 47.0* 48.3*  MCV 90.4  --  91.3  PLT 186  --  204   Basic Metabolic Panel:  Recent Labs Lab 10/07/15 2031 10/07/15 2040 10/08/15 0140  NA 137 139 138  K 3.8 3.9 4.4  CL 97* 94* 97*  CO2 34*  --  33*  GLUCOSE 110* 106* 134*  BUN 14 14 13   CREATININE 0.43* 0.70 0.54  CALCIUM 8.9  --  8.9   GFR: Estimated Creatinine Clearance: 77.4 mL/min (by C-G formula based on SCr of 0.8 mg/dL). Liver Function Tests:  Recent Labs Lab 10/07/15 2031  AST 12*  ALT 12*  ALKPHOS 70  BILITOT 0.8  PROT 7.3  ALBUMIN 3.4*   No results for input(s): LIPASE, AMYLASE in the last 168 hours. No results for input(s): AMMONIA in the last 168 hours. Coagulation Profile:  Recent Labs Lab 10/07/15 2031  INR 1.00   Cardiac Enzymes:  Recent Labs Lab 10/08/15 0140 10/08/15 0657 10/08/15 1303  TROPONINI 0.05* 0.05* 0.04*   BNP (last 3 results) No results for input(s): PROBNP in the last 8760 hours. HbA1C:  Recent Labs  10/08/15 0140  HGBA1C 6.2*   CBG: No results for input(s): GLUCAP in the last 168 hours. Lipid Profile:  Recent Labs  10/08/15 0140  CHOL 201*  HDL 35*  LDLCALC 144*  TRIG 110  CHOLHDL 5.7    Thyroid Function Tests:  Recent Labs  10/08/15 0140  TSH 0.997   Anemia Panel: No results for input(s): VITAMINB12, FOLATE, FERRITIN, TIBC, IRON, RETICCTPCT in the last 72 hours. Urine analysis: No results found for: COLORURINE, APPEARANCEUR, LABSPEC, PHURINE, GLUCOSEU, HGBUR, BILIRUBINUR, KETONESUR, PROTEINUR, UROBILINOGEN, NITRITE, LEUKOCYTESUR Sepsis Labs: Invalid input(s): PROCALCITONIN, LACTICIDVEN  Recent Results (from the past 240 hour(s))  MRSA PCR Screening     Status: None   Collection Time: 10/08/15  8:04 PM  Result Value Ref Range Status   MRSA by PCR NEGATIVE NEGATIVE Final    Comment:        The GeneXpert MRSA Assay (FDA approved for NASAL specimens only), is one component of a  comprehensive MRSA colonization surveillance program. It is not intended to diagnose MRSA infection nor to guide or monitor treatment for MRSA infections.       Radiology Studies: Ct Angio Chest Pe W/cm &/or Wo Cm  Result Date: 10/07/2015 CLINICAL DATA:  Bilateral leg swelling with chest pain EXAM: CT ANGIOGRAPHY CHEST WITH CONTRAST TECHNIQUE: Multidetector CT imaging of the chest was performed using the standard protocol during bolus administration of intravenous contrast. Multiplanar CT image reconstructions and MIPs were obtained to evaluate the vascular anatomy. CONTRAST:  85 mL Isovue 370 COMPARISON:  None. FINDINGS: Mediastinum/Lymph Nodes: No significant hilar or mediastinal adenopathy is identified. Scattered small mediastinal nodes are seen. The esophagus as visualized is within normal limits. Scattered small axillary lymph nodes are noted bilaterally with normal fatty hila. Cardiovascular: Mild aortic calcifications are noted without aneurysmal dilatation or dissection. The pulmonary artery shows a normal branching pattern without evidence of pulmonary embolism. Lungs/Pleura: Dependent atelectatic changes are noted right greater than left. No focal confluent infiltrate is seen.  No sizable effusion is noted. A parenchymal nodule is noted within the right upper lobe measuring approximately 7 mm. No other parenchymal nodules are seen. Upper abdomen: No acute findings. Musculoskeletal: No chest wall mass or suspicious bone lesions identified. Review of the MIP images confirms the above findings. IMPRESSION: Mild dependent atelectatic changes bilaterally. 7 mm right upper lobe nodule laterally. Non-contrast chest CT at 6-12 months is recommended. If the nodule is stable at time of repeat CT, then future CT at 18-24 months (from today's scan) is considered optional for low-risk patients, but is recommended for high-risk patients. This recommendation follows the consensus statement: Guidelines for Management of Incidental Pulmonary Nodules Detected on CT Images:From the Fleischner Society 2017; published online before print (10.1148/radiol.1610960454). No evidence of pulmonary emboli. Electronically Signed   By: Alcide Clever M.D.   On: 10/07/2015 21:48  Dg Chest Port 1 View  Result Date: 10/07/2015 CLINICAL DATA:  Shortness of breath EXAM: PORTABLE CHEST 1 VIEW COMPARISON:  11/12/2014 FINDINGS: Cardiac shadow is enlarged. The lungs are well aerated bilaterally. Increased vascular congestion with mild interstitial edema is noted. No bony abnormality is seen. IMPRESSION: Changes of mild CHF. Electronically Signed   By: Alcide Clever M.D.   On: 10/07/2015 20:42    Scheduled Meds: . antiseptic oral rinse  7 mL Mouth Rinse q12n4p  . aspirin EC  81 mg Oral Daily  . chlorhexidine  15 mL Mouth Rinse BID  . enoxaparin (LOVENOX) injection  55 mg Subcutaneous Q24H  . nicotine  14 mg Transdermal Daily  . predniSONE  20 mg Oral Q breakfast  . sodium chloride flush  3 mL Intravenous Q12H   Continuous Infusions:   Pamella Pert, MD, PhD [facility] Pager [phone]  If 7PM-7AM, please contact night-coverage [email or web address] Password [email or web address] 10/09/2015, 11:07 AM

## 2015-10-10 LAB — BASIC METABOLIC PANEL
Anion gap: 6 (ref 5–15)
BUN: 28 mg/dL — ABNORMAL HIGH (ref 6–20)
CALCIUM: 9 mg/dL (ref 8.9–10.3)
CHLORIDE: 98 mmol/L — AB (ref 101–111)
CO2: 35 mmol/L — AB (ref 22–32)
Creatinine, Ser: 0.52 mg/dL (ref 0.44–1.00)
GFR calc Af Amer: >60 mL/min (ref >60)
GFR calc non Af Amer: >60 mL/min (ref >60)
GLUCOSE: 115 mg/dL — AB (ref 65–99)
Potassium: 4 mmol/L (ref 3.5–5.1)
Sodium: 139 mmol/L (ref 135–145)

## 2015-10-10 MED ORDER — FUROSEMIDE 40 MG PO TABS
40.0000 mg | ORAL_TABLET | Freq: Every day | ORAL | Status: DC
  Administered 2015-10-10 – 2015-10-11 (×2): 40 mg via ORAL
  Filled 2015-10-10 (×2): qty 1

## 2015-10-10 NOTE — Progress Notes (Signed)
Pt stated (with her daughter translating) that she was not sleepy yet and did not know when she would be ready for bed.  I instructed the Pt and she agreed that she would call the RN and ask for RT when she is ready to put her CPAP on and go to sleep.  RT will continue to monitor as needed.

## 2015-10-10 NOTE — Plan of Care (Signed)
Problem: Education: Goal: Knowledge of [facility] Education information/materials will improve Outcome: Progressing Pt has had a good day. Urine output has been good. Pt says that she feels better since getting the fluid off of her. No complaints of CP or SOB. Pt has been NSR on the monitor. Will continue to monitor pt.

## 2015-10-10 NOTE — Progress Notes (Signed)
PULMONARY / CRITICAL CARE MEDICINE   Name: Frances [name] MRN: [id number] DOB: April 06, 1950    ADMISSION DATE:  10/07/2015 CONSULTATION DATE:  10/09/2015  REFERRING MD:  Dr. Wyonia Hough, [facility]  CHIEF COMPLAINT:  Short of breath  SUBJECTIVE:  Did okay with CPAP last night.  Still has cough, but not as much sputum.  Daughter translated for pt.  VITAL SIGNS: BP (!) 155/63   Pulse (!) 59   Temp 97.6 F (36.4 C) (Oral)   Resp 16   Ht  (1.499 m)   Wt 242 lb 4.6 oz (109.9 kg)   SpO2 94%   BMI 48.94 kg/m   INTAKE / OUTPUT: I/O last 3 completed shifts: In: 720 [P.O.:720] Out: 1750 [Urine:1750]  PHYSICAL EXAMINATION: General:  Sitting up in bed Neuro:  Alert, normal strength HEENT:  No stridor Cardiovascular:  Regular, no murmur Lungs:  No wheeze/rales Abdomen:  Soft, non tender Musculoskeletal:  1+ edema Skin:  No rashes  LABS:  BMET  Recent Labs Lab 10/08/15 0140 10/09/15 1128 10/10/15 0318  NA 138 136 139  K 4.4 4.2 4.0  CL 97* 93* 98*  CO2 33* 32 35*  BUN 13 21* 28*  CREATININE 0.54 0.76 0.52  GLUCOSE 134* 320* 115*    Electrolytes  Recent Labs Lab 10/08/15 0140 10/09/15 1128 10/10/15 0318  CALCIUM 8.9 9.1 9.0    CBC  Recent Labs Lab 10/07/15 2031 10/07/15 2040 10/08/15 0140  WBC 10.2  --  10.4  HGB 14.4 16.0* 14.4  HCT 46.0 47.0* 48.3*  PLT 186  --  204    Coag's  Recent Labs Lab 10/07/15 2031  APTT 30  INR 1.00    Sepsis Markers  Recent Labs Lab 10/07/15 2041  LATICACIDVEN 0.77    ABG  Recent Labs Lab 10/08/15 1121 10/08/15 1758 10/09/15 0725  PHART 7.290* 7.330* 7.430  PCO2ART 78.3* 75.9* 59.2*  PO2ART 57.7* 56.2* 63.6*    Liver Enzymes  Recent Labs Lab 10/07/15 2031  AST 12*  ALT 12*  ALKPHOS 70  BILITOT 0.8  ALBUMIN 3.4*    Cardiac Enzymes  Recent Labs Lab 10/08/15 0140 10/08/15 0657 10/08/15 1303  TROPONINI 0.05* 0.05* 0.04*    Lab Results  Component Value Date   TSH 0.997 10/08/2015     Imaging No results found.   STUDIES:  7/29 CT angio chest >> ATX, 7 mm RUL nodule 7/30 Echo >> EF 65 to 70%, grade 1 diastolic CHF, mild LA dilation  SIGNIFICANT EVENTS: 7/29 Admit 7/30 Start on BiPAP 7/31 Change to CPAP  DISCUSSION: 65 yo female smoker with morbid obesity presents with progressive dyspnea, cough, chest pain, and leg edema.  She has acute on chronic hypoxic/hypercapnic respiratory failure likely from OSA/OHS and possible COPD.  Also found to have 7 mm Rt upper lung nodule.  ASSESSMENT / PLAN:  Acute on chronic hypoxic/hypercapnic respiratory failure from OSA/OHS, ATX, CHF, and possible COPD. - oxygen to keep SpO2 90 to 95% - will need to assess for home oxygen prior to discharge  Morbid obesity with presumed OSA/OHS. - continue auto CPAP qhs >> will ask case manager to try arranging for CPAP as outpt, pending sleep study - auto CPAP settings should be pressure range of 5 to 20 cm H2O with heated humidity and mask of choice - she will need to use 3 liters oxygen at night with CPAP - weight loss - will need outpt sleep study >> she will need to have in lab sleep  study since she has Medicaid  Tobacco abuse with productive cough and concern for COPD. - changed to prednisone 20 mg daily on 7/31 >> wean off as tolerated - prn BDs - nicotine patch, smoking cessation  Rt upper lung nodule. - will need f/u CT chest w/o contrast in February 2018  Chest pain. Acute on chronic diastolic CHF. - per primary team and cardiology  Acute metabolic encephalopathy likely related to hypercapnia >> much improved. - per primary team  I have scheduled her for pulmonary office follow up with Tammy Parrett on Tuesday, August 15 at 4 pm.  Updated pt's daughter at bedside.  PCCM will sign off.  Please call if additional help is needed while she is in hospital.   Coralyn Helling, MD [facility] Pulmonary/Critical Care 10/10/2015, 8:50 AM Pager:  [phone] After 3pm call:  [phone]

## 2015-10-10 NOTE — Progress Notes (Signed)
PROGRESS NOTE  Frances [name] KJZ:[id number] DOB: [birth year] DOA: 10/07/2015 PCP: Bernerd Limbo   LOS: 3 days   Brief Narrative: 65 year old female with a 50 year smoking history was been having chest pains on and off for approximately 4-5 months. She has not been to see a PCP. She has had chest pains on and off for the last 4-5 months, throat pain and lower extremity swelling. She is also had shortness of breath for the same length of time. Yesterday all symptoms got worse.  Assessment & Plan: Active Problems:   SOB (shortness of breath)   Lower extremity edema   Syncope   Tobacco abuse   Chest pain  Acute on chronic hypoxic and hypercarbic respiratory failure with partially compensated respiratory acidosis - This is likely multifactorial due to underlying COPD, obesity hypoventilation and most likely she has obstructive sleep apnea, in addition a component of pulmonary edema due to acute on chronic diastolic heart failure - Requiring transfer to stepdown on 7/13, placed on BiPAP, pulmonology consulted - Now improved, Will need sleep study as an outpatient, but meanwhile use nightly CPAP with auto CPAP settings should be pressure range of 5 to 20 cm H2O as per pulmonology recommendations - Transfer to floor today, it remains stable may be able to go home tomorrow - Assess oxygen needs prior to discharge in the morning - set up with Pulm office in 2 weeks as an outpatient  Acute encephalopathy - Resolved  Acute on chronic probable diastolic heart failure - Most recent 2-D echo was done in September 2016, had normal EF - Chest x-ray with fluid overload, patient was placed on IV Lasix and this was transitioned to by mouth on 8/1  Chest pain - Patient with several months history of intermittent chest pressure, cardiac enzymes flat and not in a pattern consistent with ACS - EKG nonischemic - cardiology consulted for evaluation for medical management, recommended no ischemic workup at  this point  Tobacco abuse - Nicotine patch, duonebs as needed  Possible COPD exacerbation - Likely due to COPD with chronic hypercapnia. - Continue steroids, change to prednisone, taper per pulmonary  Pulmonary nodule - In a patient who smokes, will need repeat CT in 2018, Pulmonary will follow as an outpatient  Morbid obesity - Discussed with daughter bedside, recommended weight loss    DVT prophylaxis: Lovenox Code Status: Full code Family Communication: Discussed with daughter at bedside Disposition Plan: Transfer to floor, home 1-2 days  Consultants:   Cardiology  Pulmonology  Procedures:   2D echo:  Study Conclusions - Left ventricle: The cavity size was normal. There was mild concentric hypertrophy. Systolic function was vigorous. The estimated ejection fraction was in the range of 65% to 70%. Wall motion was normal; there were no regional wall motion abnormalities. Doppler parameters are consistent with abnormal left ventricular relaxation (grade 1 diastolic dysfunction). Doppler parameters are consistent with high ventricular filling pressure. - Aortic valve: Transvalvular velocity was within the normal range. There was no stenosis. There was no regurgitation. - Mitral valve: Transvalvular velocity was within the normal range. There was no evidence for stenosis. There was trivial   regurgitation. - Left atrium: The atrium was mildly dilated. - Right ventricle: The cavity size was normal. Wall thickness was normal. Systolic function was normal. - Tricuspid valve: There was trivial regurgitation.  Antimicrobials:  None    Subjective: - much more alert, no complaints  Objective: Vitals:   10/10/15 0346 10/10/15 0400 10/10/15 0600 10/10/15 0802  BP:  Marland Kitchen)  145/78 (!) 155/63   Pulse:  65 (!) 59   Resp:  (!) 27 16   Temp: 98.2 F (36.8 C)   97.6 F (36.4 C)  TempSrc: Axillary   Oral  SpO2:  91% 94%   Weight:      Height:        Intake/Output Summary  (Last 24 hours) at 10/10/15 1039 Last data filed at 10/10/15 0930  Gross per 24 hour  Intake              960 ml  Output             1425 ml  Net             -465 ml   Filed Weights   10/07/15 2002 10/08/15 0115 10/09/15 0526  Weight: 110.2 kg (243 lb) 110.1 kg (242 lb 11.2 oz) 109.9 kg (242 lb 4.6 oz)    Examination: Constitutional: NAD Vitals:   10/10/15 0346 10/10/15 0400 10/10/15 0600 10/10/15 0802  BP:  (!) 145/78 (!) 155/63   Pulse:  65 (!) 59   Resp:  (!) 27 16   Temp: 98.2 F (36.8 C)   97.6 F (36.4 C)  TempSrc: Axillary   Oral  SpO2:  91% 94%   Weight:      Height:       Eyes: PERRL, lids and conjunctivae normal ENMT: Mucous membranes are moist. Respiratory: No wheezing appreciated, faint bibasilar crackles Cardiovascular: Regular rate and rhythm, no murmurs / rubs / gallops. Trace LE edema Abdomen: no tenderness. Bowel sounds positive.  Musculoskeletal: no clubbing / cyanosis. Skin: no rashes, lesions, ulcers.  Neurologic: Nonfocal   Data Reviewed: I have personally reviewed following labs and imaging studies  CBC:  Recent Labs Lab 10/07/15 2031 10/07/15 2040 10/08/15 0140  WBC 10.2  --  10.4  NEUTROABS 6.7  --   --   HGB 14.4 16.0* 14.4  HCT 46.0 47.0* 48.3*  MCV 90.4  --  91.3  PLT 186  --  204   Basic Metabolic Panel:  Recent Labs Lab 10/07/15 2031 10/07/15 2040 10/08/15 0140 10/09/15 1128 10/10/15 0318  NA 137 139 138 136 139  K 3.8 3.9 4.4 4.2 4.0  CL 97* 94* 97* 93* 98*  CO2 34*  --  33* 32 35*  GLUCOSE 110* 106* 134* 320* 115*  BUN 14 14 13  21* 28*  CREATININE 0.43* 0.70 0.54 0.76 0.52  CALCIUM 8.9  --  8.9 9.1 9.0   GFR: Estimated Creatinine Clearance: 77.4 mL/min (by C-G formula based on SCr of 0.8 mg/dL). Liver Function Tests:  Recent Labs Lab 10/07/15 2031  AST 12*  ALT 12*  ALKPHOS 70  BILITOT 0.8  PROT 7.3  ALBUMIN 3.4*   No results for input(s): LIPASE, AMYLASE in the last 168 hours. No results for  input(s): AMMONIA in the last 168 hours. Coagulation Profile:  Recent Labs Lab 10/07/15 2031  INR 1.00   Cardiac Enzymes:  Recent Labs Lab 10/08/15 0140 10/08/15 0657 10/08/15 1303  TROPONINI 0.05* 0.05* 0.04*   BNP (last 3 results) No results for input(s): PROBNP in the last 8760 hours. HbA1C:  Recent Labs  10/08/15 0140  HGBA1C 6.2*   CBG: No results for input(s): GLUCAP in the last 168 hours. Lipid Profile:  Recent Labs  10/08/15 0140  CHOL 201*  HDL 35*  LDLCALC 144*  TRIG 110  CHOLHDL 5.7   Thyroid Function Tests:  Recent Labs  10/08/15 0140  TSH 0.997   Anemia Panel: No results for input(s): VITAMINB12, FOLATE, FERRITIN, TIBC, IRON, RETICCTPCT in the last 72 hours. Urine analysis: No results found for: COLORURINE, APPEARANCEUR, LABSPEC, PHURINE, GLUCOSEU, HGBUR, BILIRUBINUR, KETONESUR, PROTEINUR, UROBILINOGEN, NITRITE, LEUKOCYTESUR Sepsis Labs: Invalid input(s): PROCALCITONIN, LACTICIDVEN  Recent Results (from the past 240 hour(s))  MRSA PCR Screening     Status: None   Collection Time: 10/08/15  8:04 PM  Result Value Ref Range Status   MRSA by PCR NEGATIVE NEGATIVE Final    Comment:        The GeneXpert MRSA Assay (FDA approved for NASAL specimens only), is one component of a comprehensive MRSA colonization surveillance program. It is not intended to diagnose MRSA infection nor to guide or monitor treatment for MRSA infections.       Radiology Studies: No results found.   Scheduled Meds: . antiseptic oral rinse  7 mL Mouth Rinse q12n4p  . aspirin EC  81 mg Oral Daily  . enoxaparin (LOVENOX) injection  55 mg Subcutaneous Q24H  . furosemide  40 mg Oral Daily  . nicotine  14 mg Transdermal Daily  . predniSONE  20 mg Oral Q breakfast  . sodium chloride flush  3 mL Intravenous Q12H   Continuous Infusions:   Pamella Pert, MD, PhD [facility] Pager [phone]  If 7PM-7AM, please contact  night-coverage [email or web address] Password [email or web address] 10/10/2015, 10:39 AM

## 2015-10-11 DIAGNOSIS — M545 Low back pain, unspecified: Secondary | ICD-10-CM | POA: Diagnosis present

## 2015-10-11 DIAGNOSIS — R600 Localized edema: Secondary | ICD-10-CM

## 2015-10-11 DIAGNOSIS — B029 Zoster without complications: Secondary | ICD-10-CM | POA: Diagnosis present

## 2015-10-11 DIAGNOSIS — R55 Syncope and collapse: Secondary | ICD-10-CM

## 2015-10-11 DIAGNOSIS — R0603 Acute respiratory distress: Secondary | ICD-10-CM | POA: Diagnosis present

## 2015-10-11 DIAGNOSIS — R0600 Dyspnea, unspecified: Secondary | ICD-10-CM

## 2015-10-11 LAB — CBC
HEMATOCRIT: 48.9 % — AB (ref 36.0–46.0)
HEMOGLOBIN: 14.8 g/dL (ref 12.0–15.0)
MCH: 27.2 pg (ref 26.0–34.0)
MCHC: 30.3 g/dL (ref 30.0–36.0)
MCV: 89.9 fL (ref 78.0–100.0)
Platelets: 193 10*3/uL (ref 150–400)
RBC: 5.44 MIL/uL — AB (ref 3.87–5.11)
RDW: 16.4 % — ABNORMAL HIGH (ref 11.5–15.5)
WBC: 13.4 10*3/uL — ABNORMAL HIGH (ref 4.0–10.5)

## 2015-10-11 LAB — BASIC METABOLIC PANEL
ANION GAP: 9 (ref 5–15)
BUN: 21 mg/dL — ABNORMAL HIGH (ref 6–20)
CO2: 34 mmol/L — AB (ref 22–32)
Calcium: 9 mg/dL (ref 8.9–10.3)
Chloride: 96 mmol/L — ABNORMAL LOW (ref 101–111)
Creatinine, Ser: 0.61 mg/dL (ref 0.44–1.00)
GFR calc non Af Amer: >60 mL/min (ref >60)
GLUCOSE: 97 mg/dL (ref 65–99)
POTASSIUM: 3.9 mmol/L (ref 3.5–5.1)
Sodium: 139 mmol/L (ref 135–145)

## 2015-10-11 MED ORDER — HYDROCODONE-ACETAMINOPHEN 5-325 MG PO TABS: 1.0000 | ORAL_TABLET | Freq: Four times a day (QID) | ORAL | 0 refills | Status: AC | PRN

## 2015-10-11 MED ORDER — NICOTINE 14 MG/24HR TD PT24: 14.0000 mg | MEDICATED_PATCH | Freq: Every day | TRANSDERMAL | 0 refills | Status: DC

## 2015-10-11 MED ORDER — FUROSEMIDE 40 MG PO TABS: 40.0000 mg | ORAL_TABLET | Freq: Every day | ORAL | 0 refills | Status: AC

## 2015-10-11 MED ORDER — ASPIRIN 81 MG PO TBEC: 81.0000 mg | DELAYED_RELEASE_TABLET | Freq: Every day | ORAL | Status: AC

## 2015-10-11 MED ORDER — GABAPENTIN 300 MG PO CAPS: 300.0000 mg | ORAL_CAPSULE | Freq: Two times a day (BID) | ORAL | 0 refills | Status: AC

## 2015-10-11 MED ORDER — GABAPENTIN 300 MG PO CAPS
300.0000 mg | ORAL_CAPSULE | Freq: Once | ORAL | Status: AC
  Administered 2015-10-11: 300 mg via ORAL
  Filled 2015-10-11: qty 1

## 2015-10-11 MED ORDER — HYDROCODONE-ACETAMINOPHEN 5-325 MG PO TABS: 1.0000 | ORAL_TABLET | ORAL | Status: DC | PRN

## 2015-10-11 MED ORDER — PREDNISONE 20 MG PO TABS: 20.0000 mg | ORAL_TABLET | Freq: Every day | ORAL | 0 refills | Status: AC

## 2015-10-11 MED ORDER — VALACYCLOVIR HCL 1 G PO TABS: 1000.0000 mg | ORAL_TABLET | Freq: Three times a day (TID) | ORAL | 0 refills | Status: AC

## 2015-10-11 MED ORDER — VALACYCLOVIR HCL 500 MG PO TABS
1000.0000 mg | ORAL_TABLET | Freq: Three times a day (TID) | ORAL | Status: DC
  Administered 2015-10-11: 1000 mg via ORAL
  Filled 2015-10-11 (×2): qty 2

## 2015-10-11 MED FILL — FUROSEMIDE 40 MG TABLET: 40 | 14 days supply | Qty: 14 | Fill #0

## 2015-10-11 MED FILL — ?VALACYCLOVIR HCL 1 GRAM TA: 1 | 7 days supply | Qty: 21 | Fill #0

## 2015-10-11 MED FILL — predniSONE 20 MG TABS: 20 | 7 days supply | Qty: 7 | Fill #0

## 2015-10-11 MED FILL — GABAPENTIN 300 MG CAPSULE: 300 | 7 days supply | Qty: 14 | Fill #0

## 2015-10-11 NOTE — Progress Notes (Signed)
Pt did not qualify for Home O2, Sats 95% room air.

## 2015-10-11 NOTE — Discharge Summary (Signed)
Physician Discharge Summary  Frances [name] FTD:[id number] DOB: 12/11/1950 DOA: 10/07/2015  PCP: Bernerd Limbo  Admit date: 10/07/2015 Discharge date: 10/11/2015  Admitted From: Home Disposition:  Home  Recommendations for Outpatient Follow-up:  1. Follow up with PCP in 1 weeks 2. Please obtain BMP/CBC in one week  Home Health: YES Equipment/Devices: sleep study and CPAP  Discharge Condition: STABLE CODE STATUS: FULL Diet recommendation: Heart Healthy  Brief/Interim Summary: Brief Narrative: 65 year old female with a 50 year smoking history was been having chest pains on and off for approximately 4-5 months. She has not been to see a PCP. She has had chest pains on and off for the last 4-5 months, throat pain and lower extremity swelling. She is also had shortness of breath for the samelength of time. Yesterday all symptoms got worse.  Assessment & Plan: Active Problems:   SOB (shortness of breath)   Lower extremity edema   Syncope   Shingles - lower back    Tobacco abuse   Chest pain  Acute on chronic hypoxic and hypercarbic respiratory failure with partially compensated respiratory acidosis - This is likely multifactorial due to underlying COPD, obesity hypoventilation and most likely she has obstructive sleep apnea, in addition a component of pulmonary edema due to acute on chronic diastolic heart failure - Requiring transfer to stepdown on 7/13, placed on BiPAP, pulmonology consulted - Now improved, Will need sleep study as an outpatient, but meanwhile use nightly CPAP with auto CPAP settings should be pressure range of 5 to 20 cm H2O as per pulmonology recommendations - set up with Pulm office in 2 weeks as an outpatient  Acute encephalopathy - Resolved  Acute on chronic probable diastolic heart failure - Most recent 2-D echo was done in September 2016, had normal EF - Chest x-ray with fluid overload, patient was placed on IV Lasix and this was transitioned to by  mouth on 8/1  Chest pain - Patient with several months history of intermittent chest pressure, cardiac enzymes flat and not in a pattern consistent with ACS - EKG nonischemic - cardiology consulted for evaluation for medical management, recommended no ischemic workup at this point  Tobacco abuse - Nicotine patch, duonebs as needed  Acute outbreak Shingles - lower back Pain - Pt started on Valtrex 1 gm TID x 1 week, gabapentin BID, norco prn severe pain, close follow up with PCP  Possible COPD exacerbation - Likely due to COPD with chronic hypercapnia. - Continue prednisone for 1 more week, follow up with pulmonology 8/15 as scheduled.  See PCP in 1 week.   Pulmonary nodule - In a patient who smokes, will need repeat CT in 2018, Pulmonary will follow as an outpatient  Morbid obesity - Discussed with daughter bedside, recommended weight loss  DVT prophylaxis: Lovenox Code Status: Full code Family Communication: Discussed with daughter at bedside Disposition Plan: Home  Consultants:   Cardiology  Pulmonology  Procedures:   2D echo:  Study Conclusions - Left ventricle: The cavity size was normal. There was mildconcentric hypertrophy. Systolic function was vigorous. Theestimated ejection fraction was in the range of 65% to 70%. Wallmotion was normal; there were no regional wall motionabnormalities. Doppler parameters are consistent with abnormalleft ventricular relaxation (grade 1 diastolic dysfunction).Doppler parameters are consistent with high ventricular fillingpressure. - Aortic valve: Transvalvular velocity was within the normal range.There was no stenosis. There was no regurgitation. - Mitral valve: Transvalvular velocity was within the normal range.There was no evidence for stenosis. There was trivial regurgitation. - Left  atrium: The atrium was mildly dilated. - Right ventricle: The cavity size was normal. Wall thickness wasnormal. Systolic function  was normal. - Tricuspid valve: There was trivial regurgitation.  Antimicrobials:  None     Discharge Diagnoses:  Active Problems:   SOB (shortness of breath)   Lower extremity edema   Syncope   Tobacco abuse   Chest pain   Shingles outbreak   Acute low back pain   Acute respiratory distress [facility])    Discharge Instructions  Discharge Instructions    Diet - low sodium heart healthy    Complete by:  As directed   Increase activity slowly    Complete by:  As directed       Medication List    STOP taking these medications   MUCINEX FAST-MAX CONGEST COUGH 2.5-5-100 MG/5ML Liqd Generic drug:  Phenylephrine-DM-GG   NYQUIL COLD & FLU PO     TAKE these medications   aspirin 81 MG EC tablet Take 1 tablet (81 mg total) by mouth daily.   furosemide 40 MG tablet Commonly known as:  LASIX Take 1 tablet (40 mg total) by mouth daily.   gabapentin 300 MG capsule Commonly known as:  NEURONTIN Take 1 capsule (300 mg total) by mouth 2 (two) times daily. Start taking on:  10/12/2015   HYDROcodone-acetaminophen 5-325 MG tablet Commonly known as:  NORCO/VICODIN Take 1 tablet by mouth every 6 (six) hours as needed for severe pain (severe back pain).   nicotine 14 mg/24hr patch Commonly known as:  NICODERM CQ - dosed in mg/24 hours Place 1 patch (14 mg total) onto the skin daily.   predniSONE 20 MG tablet Commonly known as:  DELTASONE Take 1 tablet (20 mg total) by mouth daily with breakfast. Start taking on:  10/12/2015   valACYclovir 1000 MG tablet Commonly known as:  VALTREX Take 1 tablet (1,000 mg total) by mouth 3 (three) times daily.      Follow-up Information    Rubye Oaks, NP Follow up on 10/24/2015.   Specialty:  Pulmonary Disease Why:  Follow up with lung doctors at 4pm. Contact information: [address] [place] [address] [phone]        Bernerd Limbo. Schedule an appointment as soon as possible for a visit in 1 week(s).   Specialty:   Family Medicine Contact information: [address] [place] [address] [phone]          No Known Allergies  Procedures/Studies: Ct Angio Chest Pe W/cm &/or Wo Cm  Result Date: 10/07/2015 CLINICAL DATA:  Bilateral leg swelling with chest pain EXAM: CT ANGIOGRAPHY CHEST WITH CONTRAST TECHNIQUE: Multidetector CT imaging of the chest was performed using the standard protocol during bolus administration of intravenous contrast. Multiplanar CT image reconstructions and MIPs were obtained to evaluate the vascular anatomy. CONTRAST:  85 mL Isovue 370 COMPARISON:  None. FINDINGS: Mediastinum/Lymph Nodes: No significant hilar or mediastinal adenopathy is identified. Scattered small mediastinal nodes are seen. The esophagus as visualized is within normal limits. Scattered small axillary lymph nodes are noted bilaterally with normal fatty hila. Cardiovascular: Mild aortic calcifications are noted without aneurysmal dilatation or dissection. The pulmonary artery shows a normal branching pattern without evidence of pulmonary embolism. Lungs/Pleura: Dependent atelectatic changes are noted right greater than left. No focal confluent infiltrate is seen. No sizable effusion is noted. A parenchymal nodule is noted within the right upper lobe measuring approximately 7 mm. No other parenchymal nodules are seen. Upper abdomen: No acute findings.  Musculoskeletal: No chest wall mass or suspicious bone lesions identified. Review of the MIP images confirms the above findings. IMPRESSION: Mild dependent atelectatic changes bilaterally. 7 mm right upper lobe nodule laterally. Non-contrast chest CT at 6-12 months is recommended. If the nodule is stable at time of repeat CT, then future CT at 18-24 months (from today's scan) is considered optional for low-risk patients, but is recommended for high-risk patients. This recommendation follows the consensus statement: Guidelines for Management of Incidental Pulmonary Nodules  Detected on CT Images:From the Fleischner Society 2017; published online before print (10.1148/radiol.[id number]). No evidence of pulmonary emboli. Electronically Signed   By: Alcide Clever M.D.   On: 10/07/2015 21:48  Dg Chest Port 1 View  Result Date: 10/07/2015 CLINICAL DATA:  Shortness of breath EXAM: PORTABLE CHEST 1 VIEW COMPARISON:  11/12/2014 FINDINGS: Cardiac shadow is enlarged. The lungs are well aerated bilaterally. Increased vascular congestion with mild interstitial edema is noted. No bony abnormality is seen. IMPRESSION: Changes of mild CHF. Electronically Signed   By: Alcide Clever M.D.   On: 10/07/2015 20:42    Subjective: Pt feeling much better except the rash on lower back - Shingles outbreak.   Discharge Exam: Vitals:   10/10/15 2107 10/11/15 0523  BP: (!) 153/64 (!) 123/56  Pulse: 73 71  Resp: 16 18  Temp: 98.4 F (36.9 C) 98 F (36.7 C)   Vitals:   10/10/15 1151 10/10/15 1351 10/10/15 2107 10/11/15 0523  BP:  (!) 157/70 (!) 153/64 (!) 123/56  Pulse:  67 73 71  Resp:  Temp: 98 F (36.7 C) 98.2 F (36.8 C) 98.4 F (36.9 C) 98 F (36.7 C)  TempSrc: Oral Oral Oral Oral  SpO2:   93% 96%  Weight:      Height:             Eyes: PERRL, lids and conjunctivae normal ENMT: Mucous membranes are moist. Respiratory: No wheezing appreciated, faint bibasilar crackles Cardiovascular: Regular rate and rhythm, no murmurs / rubs / gallops. Trace LE edema Abdomen: no tenderness. Bowel sounds positive.  Musculoskeletal: no clubbing / cyanosis. Skin: no rashes, lesions, ulcers.  Neurologic: Nonfocal  The results of significant diagnostics from this hospitalization (including imaging, microbiology, ancillary and laboratory) are listed below for reference.     Microbiology: Recent Results (from the past 240 hour(s))  MRSA PCR Screening     Status: None   Collection Time: 10/08/15  8:04 PM  Result Value Ref Range Status   MRSA by PCR NEGATIVE NEGATIVE  Final    Comment:        The GeneXpert MRSA Assay (FDA approved for NASAL specimens only), is one component of a comprehensive MRSA colonization surveillance program. It is not intended to diagnose MRSA infection nor to guide or monitor treatment for MRSA infections.      Labs: BNP (last 3 results)  Recent Labs  11/12/14 0025 10/07/15 2031  BNP 45.5 182.0*   Basic Metabolic Panel:  Recent Labs Lab 10/07/15 2031 10/07/15 2040 10/08/15 0140 10/09/15 1128 10/10/15 0318 10/11/15 0519  NA 137 139 138 136 139 139  K 3.8 3.9 4.4 4.2 4.0 3.9  CL 97* 94* 97* 93* 98* 96*  CO2 34*  --  33* 32 35* 34*  GLUCOSE 110* 106* 134* 320* 115* 97  BUN 21* 28* 21*  CREATININE 0.43* 0.70 0.54 0.76 0.52 0.61  CALCIUM 8.9  --  8.9 9.1 9.0 9.0   Liver  Function Tests:  Recent Labs Lab 10/07/15 2031  AST 12*  ALT 12*  ALKPHOS 70  BILITOT 0.8  PROT 7.3  ALBUMIN 3.4*   No results for input(s): LIPASE, AMYLASE in the last 168 hours. No results for input(s): AMMONIA in the last 168 hours. CBC:  Recent Labs Lab 10/07/15 2031 10/07/15 2040 10/08/15 0140 10/11/15 0519  WBC 10.2  --  10.4 13.4*  NEUTROABS 6.7  --   --   --   HGB 14.4 16.0* 14.4 14.8  HCT 46.0 47.0* 48.3* 48.9*  MCV 90.4  --  91.3 89.9  PLT 186  --  204 193   Cardiac Enzymes:  Recent Labs Lab 10/08/15 0140 10/08/15 0657 10/08/15 1303  TROPONINI 0.05* 0.05* 0.04*   BNP: Invalid input(s): POCBNP CBG: No results for input(s): GLUCAP in the last 168 hours. D-Dimer No results for input(s): DDIMER in the last 72 hours. Hgb A1c No results for input(s): HGBA1C in the last 72 hours. Lipid Profile No results for input(s): CHOL, HDL, LDLCALC, TRIG, CHOLHDL, LDLDIRECT in the last 72 hours. Thyroid function studies No results for input(s): TSH, T4TOTAL, T3FREE, THYROIDAB in the last 72 hours.  Invalid input(s): FREET3 Anemia work up No results for input(s): VITAMINB12, FOLATE, FERRITIN, TIBC,  IRON, RETICCTPCT in the last 72 hours. Urinalysis No results found for: COLORURINE, APPEARANCEUR, LABSPEC, PHURINE, GLUCOSEU, HGBUR, BILIRUBINUR, KETONESUR, PROTEINUR, UROBILINOGEN, NITRITE, LEUKOCYTESUR Sepsis Labs Invalid input(s): PROCALCITONIN,  WBC,  LACTICIDVEN Microbiology Recent Results (from the past 240 hour(s))  MRSA PCR Screening     Status: None   Collection Time: 10/08/15  8:04 PM  Result Value Ref Range Status   MRSA by PCR NEGATIVE NEGATIVE Final    Comment:        The GeneXpert MRSA Assay (FDA approved for NASAL specimens only), is one component of a comprehensive MRSA colonization surveillance program. It is not intended to diagnose MRSA infection nor to guide or monitor treatment for MRSA infections.    Time coordinating discharge: 34 mins  SIGNED:   Standley Dakins, MD  [facility] 10/11/2015, 12:46 PM Pager   If 7PM-7AM, please contact night-coverage [email or web address] Password [email or web address]

## 2015-10-11 NOTE — Discharge Instructions (Signed)
Respiratory failure is when your lungs are not working well and your breathing (respiratory) system fails. When respiratory failure occurs, it is difficult for your lungs to get enough oxygen, get rid of carbon dioxide, or both. Respiratory failure can be life threatening.  °Respiratory failure can be acute or chronic. Acute respiratory failure is sudden, severe, and requires emergency medical treatment. Chronic respiratory failure is less severe, happens over time, and requires ongoing treatment.  °WHAT ARE THE CAUSES OF ACUTE RESPIRATORY FAILURE?  °Any problem affecting the heart or lungs can cause acute respiratory failure. Some of these causes include the following: °· Chronic bronchitis and emphysema (COPD).   °· Blood clot going to a lung (pulmonary embolism).   °· Having water in the lungs caused by heart failure, lung injury, or infection (pulmonary edema).   °· Collapsed lung (pneumothorax).   °· Pneumonia.   °· Pulmonary fibrosis.   °· Obesity.   °· Asthma.   °· Heart failure.   °· Any type of trauma to the chest that can make breathing difficult.   °· Nerve or muscle diseases making chest movements difficult. °HOW WILL MY ACUTE RESPIRATORY FAILURE BE TREATED?  °Treatment of acute respiratory failure depends on the cause of the respiratory failure. Usually, you will stay in the intensive care unit so your breathing can be watched closely. Treatment can include the following: °· Oxygen. Oxygen can be delivered through the following: °¨ Nasal cannula. This is small tubing that goes in your nose to give you oxygen. °¨ Face mask. A face mask covers your nose and mouth to give you oxygen. °· Medicine. Different medicines can be given to help with breathing. These can include: °¨ Nebulizers. Nebulizers deliver medicines to open the air passages (bronchodilators). These medicines help to open or relax the airways in the lungs so you can breathe better. They can also help loosen mucus from your  lungs. °¨ Diuretics. Diuretic medicines can help you breathe better by getting rid of extra water in your body. °¨ Steroids. Steroid medicines can help decrease swelling (inflammation) in your lungs. °¨ Antibiotics. °· Chest tube. If you have a collapsed lung (pneumothorax), a chest tube is placed to help reinflate the lung. °· Noninvasive positive pressure ventilation (NPPV). This is a tight-fitting mask that goes over your nose and mouth. The mask has tubing that is attached to a machine. The machine blows air into the tubing, which helps to keep the tiny air sacs (alveoli) in your lungs open. This machine allows you to breathe on your own. °· Ventilator. A ventilator is a breathing machine. When on a ventilator, a breathing tube is put into the lungs. A ventilator is used when you can no longer breathe well enough on your own. You may have low oxygen levels or high carbon dioxide (CO2) levels in your blood. When you are on a ventilator, sedation and pain medicines are given to make you sleep so your lungs can heal. °SEEK IMMEDIATE MEDICAL CARE IF: °· You have shortness of breath (dyspnea) with or without activity. °· You have rapid breathing (tachypnea). °· You are wheezing. °· You are unable to say more than a few words without having to catch your breath. °· You find it very difficult to function normally. °· You have a fast heart rate. °· You have a bluish color to your finger or toe nail beds. °· You have confusion or drowsiness or both. °  °This information is not intended to replace advice given to you by your health care provider. Make sure you discuss   any questions you have with your health care provider.   Document Released: 03/02/2013 Document Revised: 11/16/2014 Document Reviewed: 03/02/2013 Elsevier Interactive Patient Education 2016 [employer]   Chronic Obstructive Pulmonary Disease Chronic obstructive pulmonary disease (COPD) is a common lung problem. In COPD, the flow of air from the  lungs is limited. The way your lungs work will probably never return to normal, but there are things you can do to improve your lungs and make yourself feel better. Your doctor may treat your condition with:  Medicines.  Oxygen.  Lung surgery.  Changes to your diet.  Rehabilitation. This may involve a team of specialists. HOME CARE  Take all medicines as told by your doctor.  Avoid medicines or cough syrups that dry up your airway (such as antihistamines) and do not allow you to get rid of thick spit. You do not need to avoid them if told differently by your doctor.  If you smoke, stop. Smoking makes the problem worse.  Avoid being around things that make your breathing worse (like smoke, chemicals, and fumes).  Use oxygen therapy and therapy to help improve your lungs (pulmonary rehabilitation) if told by your doctor. If you need home oxygen therapy, ask your doctor if you should buy a tool to measure your oxygen level (oximeter).  Avoid people who have a sickness you can catch (contagious).  Avoid going outside when it is very hot, cold, or humid.  Eat healthy foods. Eat smaller meals more often. Rest before meals.  Stay active, but remember to also rest.  Make sure to get all the shots (vaccines) your doctor recommends. Ask your doctor if you need a pneumonia shot.  Learn and use tips on how to relax.  Learn and use tips on how to control your breathing as told by your doctor. Try:  Breathing in (inhaling) through your nose for 1 second. Then, pucker your lips and breath out (exhale) through your lips for 2 seconds.  Putting one hand on your belly (abdomen). Breathe in slowly through your nose for 1 second. Your hand on your belly should move out. Pucker your lips and breathe out slowly through your lips. Your hand on your belly should move in as you breathe out.  Learn and use controlled coughing to clear thick spit from your lungs. The steps are: 1. Lean your head a  little forward. 2. Breathe in deeply. 3. Try to hold your breath for 3 seconds. 4. Keep your mouth slightly open while coughing 2 times. 5. Spit any thick spit out into a tissue. 6. Rest and do the steps again 1 or 2 times as needed. GET HELP IF:  You cough up more thick spit than usual.  There is a change in the color or thickness of the spit.  It is harder to breathe than usual.  Your breathing is faster than usual. GET HELP RIGHT AWAY IF:  You have shortness of breath while resting.  You have shortness of breath that stops you from:  Being able to talk.  Doing normal activities.  You chest hurts for longer than 5 minutes.  Your skin color is more blue than usual.  Your pulse oximeter shows that you have low oxygen for longer than 5 minutes. MAKE SURE YOU:  Understand these instructions.  Will watch your condition.  Will get help right away if you are not doing well or get worse.   This information is not intended to replace advice given to you by your  health care provider. Make sure you discuss any questions you have with your health care provider.   Document Released: 08/14/2007 Document Revised: 03/18/2014 Document Reviewed: 10/22/2012 Elsevier Interactive Patient Education 2016 [employer] Inc[employer]   Cough, Adult Coughing is a reflex that clears your throat and your airways. Coughing helps to heal and protect your lungs. It is normal to cough occasionally, but a cough that happens with other symptoms or lasts a long time may be a sign of a condition that needs treatment. A cough may last only 2-3 weeks (acute), or it may last longer than 8 weeks (chronic). CAUSES Coughing is commonly caused by:  Breathing in substances that irritate your lungs.  A viral or bacterial respiratory infection.  Allergies.  Asthma.  Postnasal drip.  Smoking.  Acid backing up from the stomach into the esophagus (gastroesophageal reflux).  Certain medicines.  Chronic lung  problems, including COPD (or rarely, lung cancer).  Other medical conditions such as heart failure. HOME CARE INSTRUCTIONS  Pay attention to any changes in your symptoms. Take these actions to help with your discomfort:  Take medicines only as told by your health care provider.  If you were prescribed an antibiotic medicine, take it as told by your health care provider. Do not stop taking the antibiotic even if you start to feel better.  Talk with your health care provider before you take a cough suppressant medicine.  Drink enough fluid to keep your urine clear or pale yellow.  If the air is dry, use a cold steam vaporizer or humidifier in your bedroom or your home to help loosen secretions.  Avoid anything that causes you to cough at work or at home.  If your cough is worse at night, try sleeping in a semi-upright position.  Avoid cigarette smoke. If you smoke, quit smoking. If you need help quitting, ask your health care provider.  Avoid caffeine.  Avoid alcohol.  Rest as needed. SEEK MEDICAL CARE IF:   You have new symptoms.  You cough up pus.  Your cough does not get better after 2-3 weeks, or your cough gets worse.  You cannot control your cough with suppressant medicines and you are losing sleep.  You develop pain that is getting worse or pain that is not controlled with pain medicines.  You have a fever.  You have unexplained weight loss.  You have night sweats. SEEK IMMEDIATE MEDICAL CARE IF:  You cough up blood.  You have difficulty breathing.  Your heartbeat is very fast.   This information is not intended to replace advice given to you by your health care provider. Make sure you discuss any questions you have with your health care provider.   Document Released: 08/24/2010 Document Revised: 11/16/2014 Document Reviewed: 05/04/2014 Elsevier Interactive Patient Education 2016 [employer]  Shingles Shingles is an infection that causes a painful  skin rash and fluid-filled blisters. Shingles is caused by the same virus that causes chickenpox. Shingles only develops in people who:  Have had chickenpox.  Have gotten the chickenpox vaccine. (This is rare.) The first symptoms of shingles may be itching, tingling, or pain in an area on your skin. A rash will follow in a few days or weeks. The rash is usually on one side of the body in a bandlike or beltlike pattern. Over time, the rash turns into fluid-filled blisters that break open, scab over, and dry up. Medicines may:  Help you manage pain.  Help you recover more quickly.  Help to  prevent long-term problems. HOME CARE Medicines  Take medicines only as told by your doctor.  Apply an anti-itch or numbing cream to the affected area as told by your doctor. Blister and Rash Care  Take a cool bath or put cool compresses on the area of the rash or blisters as told by your doctor. This may help with pain and itching.  Keep your rash covered with a loose bandage (dressing). Wear loose-fitting clothing.  Keep your rash and blisters clean with mild soap and cool water or as told by your doctor.  Check your rash every day for signs of infection. These include redness, swelling, and pain that lasts or gets worse.  Do not pick your blisters.  Do not scratch your rash. General Instructions  Rest as told by your doctor.  Keep all follow-up visits as told by your doctor. This is important.  Until your blisters scab over, your infection can cause chickenpox in people who have never had it or been vaccinated against it. To prevent this from happening, avoid touching other people or being around other people, especially:  Babies.  Pregnant women.  Children who have eczema.  Elderly people who have transplants.  People who have chronic illnesses, such as leukemia or AIDS. GET HELP IF:  Your pain does not get better with medicine.  Your pain does not get better after the rash  heals.  Your rash looks infected. Signs of infection include:  Redness.  Swelling.  Pain that lasts or gets worse. GET HELP RIGHT AWAY IF:  The rash is on your face or nose.  You have pain in your face, pain around your eye area, or loss of feeling on one side of your face.  You have ear pain or you have ringing in your ear.  You have loss of taste.  Your condition gets worse.   This information is not intended to replace advice given to you by your health care provider. Make sure you discuss any questions you have with your health care provider.   Document Released: 08/14/2007 Document Revised: 03/18/2014 Document Reviewed: 12/07/2013 Elsevier Interactive Patient Education 2016 [employer] Inc.   Sleep Apnea Sleep apnea is disorder that affects a person's sleep. A person with sleep apnea has abnormal pauses in their breathing when they sleep. It is hard for them to get a good sleep. This makes a person tired during the day. It also can lead to other physical problems. There are three types of sleep apnea. One type is when breathing stops for a short time because your airway is blocked (obstructive sleep apnea). Another type is when the brain sometimes fails to give the normal signal to breathe to the muscles that control your breathing (central sleep apnea). The third type is a combination of the other two types. HOME CARE  Do not sleep on your back. Try to sleep on your side.  Take all medicine as told by your doctor.  Avoid alcohol, calming medicines (sedatives), and depressant drugs.  Try to lose weight if you are overweight. Talk to your doctor about a healthy weight goal. Your doctor may have you use a device that helps to open your airway. It can help you get the air that you need. It is called a positive airway pressure (PAP) device. There are three types of PAP devices:  Continuous positive airway pressure (CPAP) device.  Nasal expiratory positive airway pressure  (EPAP) device.  Bilevel positive airway pressure (BPAP) device. MAKE SURE YOU:  Understand these instructions.  Will watch your condition.  Will get help right away if you are not doing well or get worse.   This information is not intended to replace advice given to you by your health care provider. Make sure you discuss any questions you have with your health care provider.   Document Released: 12/05/2007 Document Revised: 03/18/2014 Document Reviewed: 06/29/2011 Elsevier Interactive Patient Education 2016 [employer] Inc.   CPAP and BIPAP Information CPAP and BIPAP are methods of helping you breathe with the use of air pressure. CPAP stands for "continuous positive airway pressure." BIPAP stands for "bi-level positive airway pressure." In both methods, air is blown into your air passages to help keep you breathing well. With CPAP, the amount of pressure stays the same while you breathe in and out. CPAP is most commonly used for obstructive sleep apnea. For obstructive sleep apnea, CPAP works by holding your airways open so that they do not collapse when your muscles relax during sleep. BIPAP is similar to CPAP except the amount of pressure is increased when you inhale. This helps you take larger breaths. Your health care provider will recommend whether CPAP or BIPAP would be more helpful for you.  WHY ARE CPAP AND BIPAP TREATMENTS USED? CPAP or BIPAP can be helpful if you have:   Sleep apnea.   Chronic obstructive pulmonary disease (COPD).   Diseases that weaken the muscles of the chest, including muscular dystrophy or neurological diseases such as amyotrophic lateral sclerosis (ALS).  Other problems that cause breathing to be weak, abnormal, or difficult.  HOW IS CPAP OR BIPAP ADMINISTERED? Both CPAP and BIPAP are provided by a small machine with a flexible plastic tube that attaches to a plastic mask. The mask fits on your face, and air is blown into your air passages through your  nose or mouth. The amount of pressure that is used to blow the air into your air passages can be set on the machine. Your health care provider will determine the pressure setting that should be used based on your individual needs. WHEN SHOULD CPAP OR BIPAP BE USED? In most cases, the mask is worn only when sleeping. Generally, you will need to wear the mask throughout the night and during the daytime if you take a nap. In a few cases involving certain medical conditions, people also need to wear the mask at other times when they are awake. Follow your health care provider's instructions for when to use the machine.  USING THE MASK  Because the mask needs to be snug, some people feel a trapped or closed-in feeling (claustrophobic) when first using the mask. You may need to get used to the mask gradually. To do this, you can first hold the mask loosely over your nose or mouth. Gradually apply the mask more snugly. You can also gradually increase the amount of time that you use the mask.  Masks are available in various types and sizes. Some fit over your mouth and nose, and some fit over just your nose. If your mask does not fit well, talk to your health care provider about getting a different one.  If you are using a nasal mask and you tend to breathe through your mouth, a chin strap may be applied to help keep your mouth closed.   The CPAP and BIPAP machines have alarms that may sound if the mask comes off or develops a leak.  If you have trouble with the mask, it is very important  that you talk to your health care provider about finding a way to make the mask easier to tolerate. Do not stop using the mask. This could have a negative impact on your health. TIPS FOR USING THE MACHINE  Place your CPAP or BIPAP machine on a secure table or stand near an electrical outlet.   Know where the on-off switch is located on the machine.  Follow your health care provider's instructions for how to set the  pressure on your machine and when you should use it.  Do not eat or drink while the CPAP or BIPAP machine is on. Food or fluids could get pushed into your lungs by the pressure of the CPAP or BIPAP.  Do not smoke. Tobacco smoke residue can damage the machine.   For home use, CPAP and BIPAP machines can be rented or purchased through home health care companies. Many different brands of machines are available. Renting a machine before purchasing may help you find out which particular machine works well for you. SEEK IMMEDIATE MEDICAL CARE IF:  You have redness or open areas around your nose or mouth where the mask fits.   You have trouble operating the CPAP or BIPAP machine.   You cannot tolerate wearing the CPAP or BIPAP mask.    This information is not intended to replace advice given to you by your health care provider. Make sure you discuss any questions you have with your health care provider.   Document Released: 11/24/2003 Document Revised: 03/18/2014 Document Reviewed: 09/24/2012 Elsevier Interactive Patient Education 2016 [employer].   Smoking Cessation, Tips for Success If you are ready to quit smoking, congratulations! You have chosen to help yourself be healthier. Cigarettes bring nicotine, tar, carbon monoxide, and other irritants into your body. Your lungs, heart, and blood vessels will be able to work better without these poisons. There are many different ways to quit smoking. Nicotine gum, nicotine patches, a nicotine inhaler, or nicotine nasal spray can help with physical craving. Hypnosis, support groups, and medicines help break the habit of smoking. WHAT THINGS CAN I DO TO MAKE QUITTING EASIER?  Here are some tips to help you quit for good:  Pick a date when you will quit smoking completely. Tell all of your friends and family about your plan to quit on that date.  Do not try to slowly cut down on the number of cigarettes you are smoking. Pick a quit date and quit  smoking completely starting on that day.  Throw away all cigarettes.   Clean and remove all ashtrays from your home, work, and car.  On a card, write down your reasons for quitting. Carry the card with you and read it when you get the urge to smoke.  Cleanse your body of nicotine. Drink enough water and fluids to keep your urine clear or pale yellow. Do this after quitting to flush the nicotine from your body.  Learn to predict your moods. Do not let a bad situation be your excuse to have a cigarette. Some situations in your life might tempt you into wanting a cigarette.  Never have "just one" cigarette. It leads to wanting another and another. Remind yourself of your decision to quit.  Change habits associated with smoking. If you smoked while driving or when feeling stressed, try other activities to replace smoking. Stand up when drinking your coffee. Brush your teeth after eating. Sit in a different chair when you read the paper. Avoid alcohol while trying to quit,  and try to drink fewer caffeinated beverages. Alcohol and caffeine may urge you to smoke.  Avoid foods and drinks that can trigger a desire to smoke, such as sugary or spicy foods and alcohol.  Ask people who smoke not to smoke around you.  Have something planned to do right after eating or having a cup of coffee. For example, plan to take a walk or exercise.  Try a relaxation exercise to calm you down and decrease your stress. Remember, you may be tense and nervous for the first 2 weeks after you quit, but this will pass.  Find new activities to keep your hands busy. Play with a pen, coin, or rubber band. Doodle or draw things on paper.  Brush your teeth right after eating. This will help cut down on the craving for the taste of tobacco after meals. You can also try mouthwash.   Use oral substitutes in place of cigarettes. Try using lemon drops, carrots, cinnamon sticks, or chewing gum. Keep them handy so they are  available when you have the urge to smoke.  When you have the urge to smoke, try deep breathing.  Designate your home as a nonsmoking area.  If you are a heavy smoker, ask your health care provider about a prescription for nicotine chewing gum. It can ease your withdrawal from nicotine.  Reward yourself. Set aside the cigarette money you save and buy yourself something nice.  Look for support from others. Join a support group or smoking cessation program. Ask someone at home or at work to help you with your plan to quit smoking.  Always ask yourself, "Do I need this cigarette or is this just a reflex?" Tell yourself, "Today, I choose not to smoke," or "I do not want to smoke." You are reminding yourself of your decision to quit.  Do not replace cigarette smoking with electronic cigarettes (commonly called e-cigarettes). The safety of e-cigarettes is unknown, and some may contain harmful chemicals.  If you relapse, do not give up! Plan ahead and think about what you will do the next time you get the urge to smoke. HOW WILL I FEEL WHEN I QUIT SMOKING? You may have symptoms of withdrawal because your body is used to nicotine (the addictive substance in cigarettes). You may crave cigarettes, be irritable, feel very hungry, cough often, get headaches, or have difficulty concentrating. The withdrawal symptoms are only temporary. They are strongest when you first quit but will go away within 10-14 days. When withdrawal symptoms occur, stay in control. Think about your reasons for quitting. Remind yourself that these are signs that your body is healing and getting used to being without cigarettes. Remember that withdrawal symptoms are easier to treat than the major diseases that smoking can cause.  Even after the withdrawal is over, expect periodic urges to smoke. However, these cravings are generally short lived and will go away whether you smoke or not. Do not smoke! WHAT RESOURCES ARE AVAILABLE TO  HELP ME QUIT SMOKING? Your health care provider can direct you to community resources or hospitals for support, which may include:  Group support.  Education.  Hypnosis.  Therapy.   This information is not intended to replace advice given to you by your health care provider. Make sure you discuss any questions you have with your health care provider.   Document Released: 11/24/2003 Document Revised: 03/18/2014 Document Reviewed: 08/13/2012 Elsevier Interactive Patient Education 2016 [employer].   Steps to Quit Smoking  Smoking tobacco can  be harmful to your health and can affect almost every organ in your body. Smoking puts you, and those around you, at risk for developing many serious chronic diseases. Quitting smoking is difficult, but it is one of the best things that you can do for your health. It is never too late to quit. WHAT ARE THE BENEFITS OF QUITTING SMOKING? When you quit smoking, you lower your risk of developing serious diseases and conditions, such as:  Lung cancer or lung disease, such as COPD.  Heart disease.  Stroke.  Heart attack.  Infertility.  Osteoporosis and bone fractures. Additionally, symptoms such as coughing, wheezing, and shortness of breath may get better when you quit. You may also find that you get sick less often because your body is stronger at fighting off colds and infections. If you are pregnant, quitting smoking can help to reduce your chances of having a baby of low birth weight. HOW DO I GET READY TO QUIT? When you decide to quit smoking, create a plan to make sure that you are successful. Before you quit:  Pick a date to quit. Set a date within the next two weeks to give you time to prepare.  Write down the reasons why you are quitting. Keep this list in places where you will see it often, such as on your bathroom mirror or in your car or wallet.  Identify the people, places, things, and activities that make you want to smoke  (triggers) and avoid them. Make sure to take these actions:  Throw away all cigarettes at home, at work, and in your car.  Throw away smoking accessories, such as Set designer.  Clean your car and make sure to empty the ashtray.  Clean your home, including curtains and carpets.  Tell your family, friends, and coworkers that you are quitting. Support from your loved ones can make quitting easier.  Talk with your health care provider about your options for quitting smoking.  Find out what treatment options are covered by your health insurance. WHAT STRATEGIES CAN I USE TO QUIT SMOKING?  Talk with your healthcare provider about different strategies to quit smoking. Some strategies include:  Quitting smoking altogether instead of gradually lessening how much you smoke over a period of time. Research shows that quitting "cold [place]" is more successful than gradually quitting.  Attending in-person counseling to help you build problem-solving skills. You are more likely to have success in quitting if you attend several counseling sessions. Even short sessions of 10 minutes can be effective.  Finding resources and support systems that can help you to quit smoking and remain smoke-free after you quit. These resources are most helpful when you use them often. They can include:  Online chats with a Veterinary surgeon.  Telephone quitlines.  Printed Materials engineer.  Support groups or group counseling.  Text messaging programs.  Mobile phone applications.  Taking medicines to help you quit smoking. (If you are pregnant or breastfeeding, talk with your health care provider first.) Some medicines contain nicotine and some do not. Both types of medicines help with cravings, but the medicines that include nicotine help to relieve withdrawal symptoms. Your health care provider may recommend:  Nicotine patches, gum, or lozenges.  Nicotine inhalers or sprays.  Non-nicotine medicine that  is taken by mouth. Talk with your health care provider about combining strategies, such as taking medicines while you are also receiving in-person counseling. Using these two strategies together makes you more likely to succeed in quitting  than if you used either strategy on its own. If you are pregnant or breastfeeding, talk with your health care provider about finding counseling or other support strategies to quit smoking. Do not take medicine to help you quit smoking unless told to do so by your health care provider. WHAT THINGS CAN I DO TO MAKE IT EASIER TO QUIT? Quitting smoking might feel overwhelming at first, but there is a lot that you can do to make it easier. Take these important actions:  Reach out to your family and friends and ask that they support and encourage you during this time. Call telephone quitlines, reach out to support groups, or work with a counselor for support.  Ask people who smoke to avoid smoking around you.  Avoid places that trigger you to smoke, such as bars, parties, or smoke-break areas at work.  Spend time around people who do not smoke.  Lessen stress in your life, because stress can be a smoking trigger for some people. To lessen stress, try:  Exercising regularly.  Deep-breathing exercises.  Yoga.  Meditating.  Performing a body scan. This involves closing your eyes, scanning your body from head to toe, and noticing which parts of your body are particularly tense. Purposefully relax the muscles in those areas.  Download or purchase mobile phone or tablet apps (applications) that can help you stick to your quit plan by providing reminders, tips, and encouragement. There are many free apps, such as QuitGuide from the [employer] developer for Disease Control and Prevention). You can find other support for quitting smoking (smoking cessation) through smokefree.gov and other websites. HOW WILL I FEEL WHEN I QUIT SMOKING? Within the first 24 hours of quitting  smoking, you may start to feel some withdrawal symptoms. These symptoms are usually most noticeable 2-3 days after quitting, but they usually do not last beyond 2-3 weeks. Changes or symptoms that you might experience include:  Mood swings.  Restlessness, anxiety, or irritation.  Difficulty concentrating.  Dizziness.  Strong cravings for sugary foods in addition to nicotine.  Mild weight gain.  Constipation.  Nausea.  Coughing or a sore throat.  Changes in how your medicines work in your body.  A depressed mood.  Difficulty sleeping (insomnia). After the first 2-3 weeks of quitting, you may start to notice more positive results, such as:  Improved sense of smell and taste.  Decreased coughing and sore throat.  Slower heart rate.  Lower blood pressure.  Clearer skin.  The ability to breathe more easily.  Fewer sick days. Quitting smoking is very challenging for most people. Do not get discouraged if you are not successful the first time. Some people need to make many attempts to quit before they achieve long-term success. Do your best to stick to your quit plan, and talk with your health care provider if you have any questions or concerns.   This information is not intended to replace advice given to you by your health care provider. Make sure you discuss any questions you have with your health care provider.   Document Released: 02/19/2001 Document Revised: 07/12/2014 Document Reviewed: 07/12/2014 Elsevier Interactive Patient Education [employer]

## 2015-10-11 NOTE — Progress Notes (Signed)
Sleep Study request faxed to [facility] Sleep Study [id number].  Pt discharging with [facility] HHRN/NA.

## 2015-10-16 ENCOUNTER — Ambulatory Visit (HOSPITAL_BASED_OUTPATIENT_CLINIC_OR_DEPARTMENT_OTHER): Payer: Medicaid Other | Attending: Family Medicine | Admitting: Internal Medicine

## 2015-10-16 VITALS — Ht 64.0 in | Wt 249.1 lb

## 2015-10-16 DIAGNOSIS — G4710 Hypersomnia, unspecified: Secondary | ICD-10-CM

## 2015-10-16 DIAGNOSIS — R454 Irritability and anger: Secondary | ICD-10-CM | POA: Insufficient documentation

## 2015-10-16 DIAGNOSIS — G4733 Obstructive sleep apnea (adult) (pediatric): Secondary | ICD-10-CM | POA: Insufficient documentation

## 2015-10-16 DIAGNOSIS — G4719 Other hypersomnia: Secondary | ICD-10-CM | POA: Insufficient documentation

## 2015-10-16 DIAGNOSIS — R0683 Snoring: Secondary | ICD-10-CM | POA: Diagnosis not present

## 2015-10-16 DIAGNOSIS — Z79899 Other long term (current) drug therapy: Secondary | ICD-10-CM | POA: Diagnosis not present

## 2015-10-16 DIAGNOSIS — R5383 Other fatigue: Secondary | ICD-10-CM | POA: Diagnosis not present

## 2015-10-16 DIAGNOSIS — I493 Ventricular premature depolarization: Secondary | ICD-10-CM | POA: Diagnosis not present

## 2015-10-18 IMAGING — DX DG CHEST 2V
2 series · 2 of 2 positions shown · non-contrast
Comparison: Chest radiograph performed 08/19/2014

CLINICAL DATA: Acute onset of bilateral leg swelling. Initial
encounter.

EXAM:
CHEST  2 VIEW

[w chest pa]
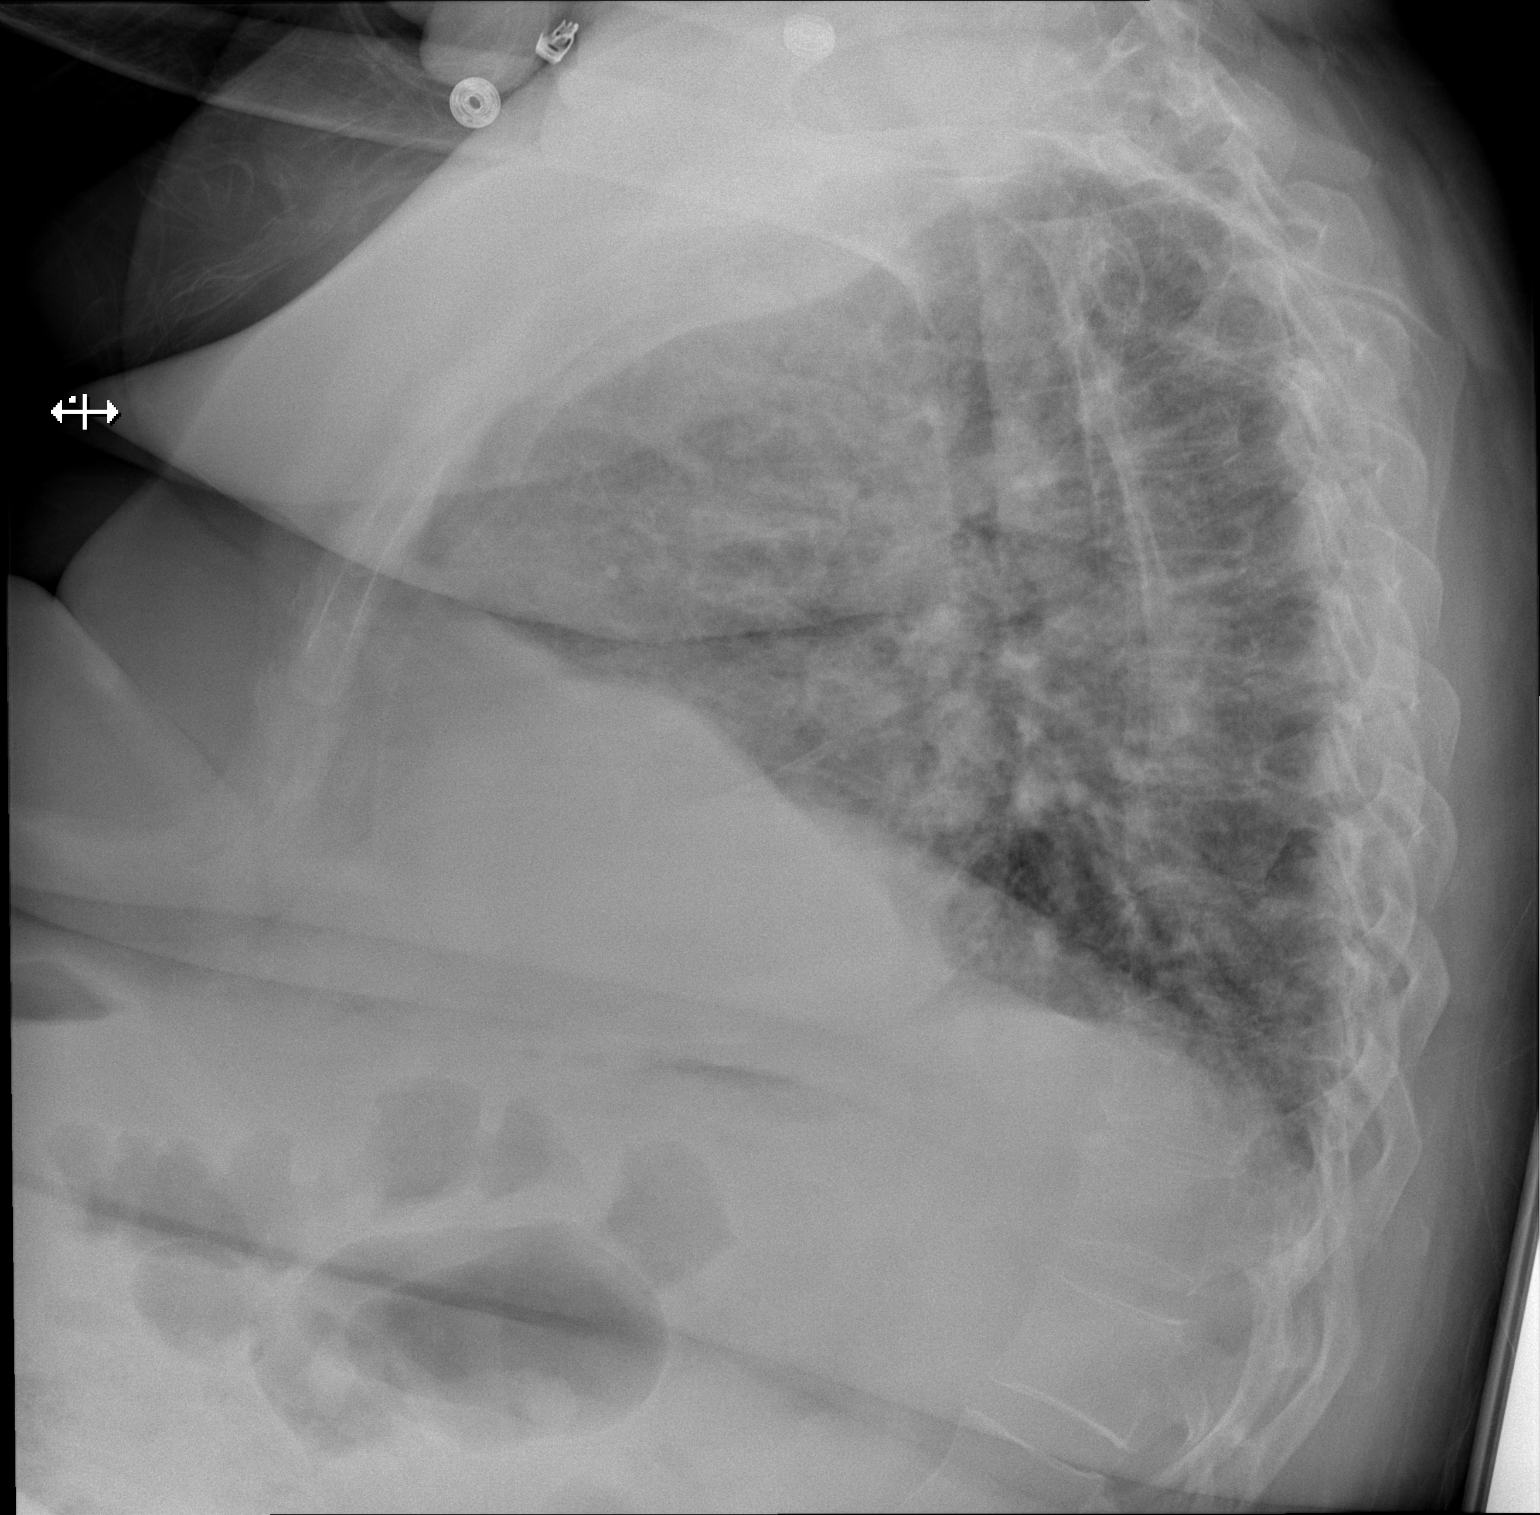

[x chest ap]
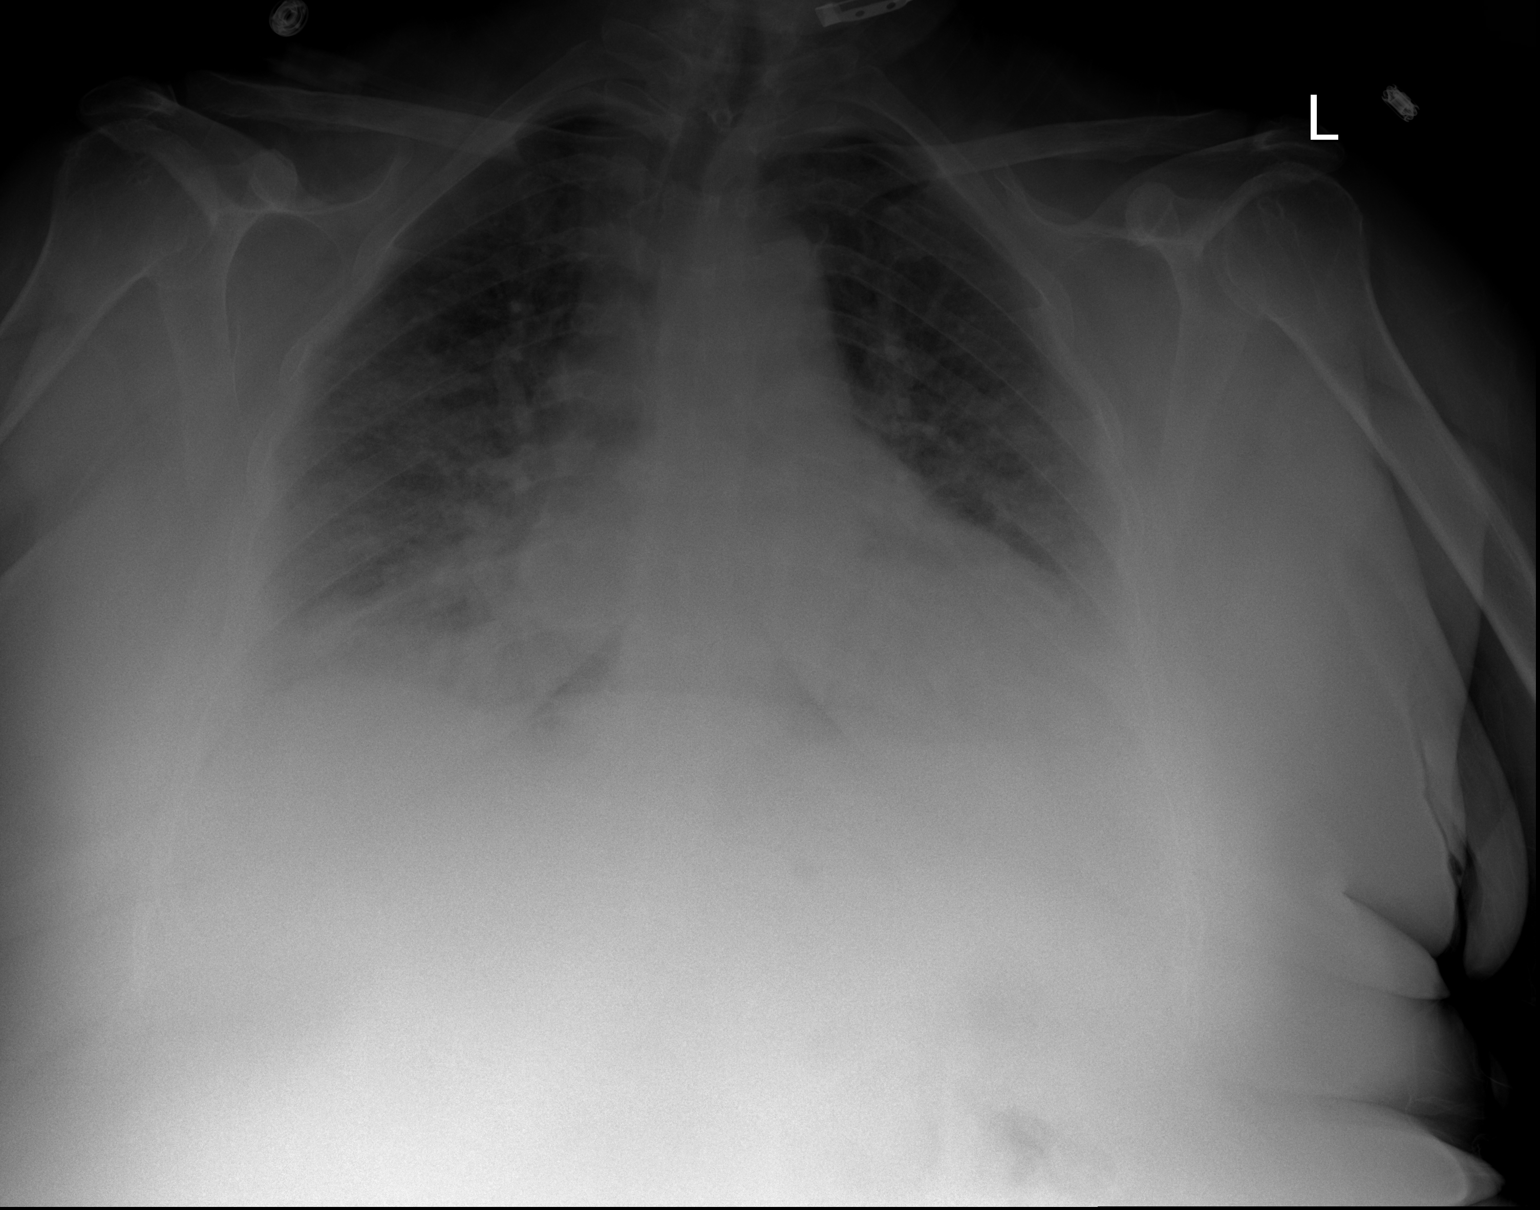

[2 of 2 positions shown; findings below may reference images not displayed]

FINDINGS: The lungs are well-aerated. Vascular congestion is noted. Increased
interstitial markings raise concern for mild pulmonary edema. No
definite pleural effusion or pneumothorax is seen.

The heart is borderline enlarged. No acute osseous abnormalities are
seen.
IMPRESSION: Vascular congestion and borderline cardiomegaly. Increased
interstitial markings raise concern for mild pulmonary edema.

## 2015-10-21 DIAGNOSIS — R0683 Snoring: Secondary | ICD-10-CM | POA: Diagnosis not present

## 2015-10-21 NOTE — Procedures (Signed)
Patient Name: Frances, [name] Date: 10/16/2015 Gender: Female D.O.B: 1950/06/05 Age (years): 64 Referring Provider: Clanford [name] Height (inches): 64 Interpreting Physician: Frances Lyons MD, ABSM Weight (lbs): 249 RPSGT: Frances [name] BMI: 43 MRN: [id number] Neck Size: 16.00 CLINICAL INFORMATION Sleep Study Type: Split Night CPAP Indication for sleep study: Excessive Daytime Sleepiness, Fatigue, Snoring Epworth Sleepiness Score: 10  SLEEP STUDY TECHNIQUE As per the AASM Manual for the Scoring of Sleep and Associated Events v2.3 (April 2016) with a hypopnea requiring 4% desaturations. The channels recorded and monitored were frontal, central and occipital EEG, electrooculogram (EOG), submentalis EMG (chin), nasal and oral airflow, thoracic and abdominal wall motion, anterior tibialis EMG, snore microphone, electrocardiogram, and pulse oximetry. Continuous positive airway pressure (CPAP) was initiated when the patient met split night criteria and was titrated according to treat sleep-disordered breathing.  MEDICATIONS Medications taken by the patient : charted for review Medications administered by patient during sleep study : NEURONTIN, VALTREX  RESPIRATORY PARAMETERS Diagnostic Total AHI (/hr): 44.9 RDI (/hr): 55.4 OA Index (/hr): 2.7 CA Index (/hr): 0.0 REM AHI (/hr): N/A NREM AHI (/hr): 44.9 Supine AHI (/hr): N/A Non-supine AHI (/hr): 44.89 Min O2 Sat (%): 51.00 Mean O2 (%): 83.20 Time below 88% (min): 126.1   Titration Optimal Pressure (cm): 12 AHI at Optimal Pressure (/hr): 0.0 Min O2 at Optimal Pressure (%): 91.0 Supine % at Optimal (%): 0 Sleep % at Optimal (%): 99    SLEEP ARCHITECTURE The recording time for the entire night was 378.3 minutes. During a baseline period of 202.8 minutes, the patient slept for 131.0 minutes in REM and nonREM, yielding a sleep efficiency of 64.6%. Sleep onset after lights out was 19.6 minutes with a REM latency of N/A minutes.  The patient spent 21.37% of the night in stage N1 sleep, 76.72% in stage N2 sleep, 1.91% in stage N3 and 0.00% in REM. During the titration period of 174.2 minutes, the patient slept for 129.0 minutes in REM and nonREM, yielding a sleep efficiency of 74.1%. Sleep onset after CPAP initiation was 11.0 minutes with a REM latency of 52.0 minutes. The patient spent 17.05% of the night in stage N1 sleep, 58.14% in stage N2 sleep, 0.00% in stage N3 and 24.81% in REM.  CARDIAC DATA The 2 lead EKG demonstrated sinus rhythm. The mean heart rate was 78.79 beats per minute. Other EKG findings include: PVCs.  LEG MOVEMENT DATA The total Periodic Limb Movements of Sleep (PLMS) were 11. The PLMS index was 2.54 .  IMPRESSIONS - Severe obstructive sleep apnea occurred during the diagnostic portion of the study (AHI = 44.9/hour). An optimal PAP pressure was selected for this patient ( 12 cm of water) - No significant central sleep apnea occurred during the diagnostic portion of the study (CAI = 0.0/hour). - Severe oxygen desaturation was noted during the diagnostic portion of the study (Min O2 = 51.00%). - Supplemental O2 1L was added per protocol for persistent desaturation below 88% on CPAP. - The patient snored with Moderate snoring volume during the diagnostic portion of the study. - EKG findings include PVCs. - Mild periodic limb movements of sleep occurred during the study.  DIAGNOSIS - Obstructive Sleep Apnea (327.23 [G47.33 ICD-10])  RECOMMENDATIONS - Trial of CPAP therapy on 12 cm H2O with a Medium size Resmed Full Face Mask AirFit F20 mask and heated humidification. - Consider evaluation for baseline oxygen desaturation- possible underlying cardiopulmonary disease - Avoid alcohol, sedatives and other CNS depressants that may worsen sleep apnea and  disrupt normal sleep architecture. - Sleep hygiene should be reviewed to assess factors that may improve sleep quality. - Weight management and regular  exercise should be initiated or continued.  [Electronically signed] 10/21/2015 12:35 PM  Frances Lyons MD, [place], American Board of Sleep Medicine   NPI: [id number]  [place], [place] of Sleep [facility]  ELECTRONICALLY SIGNED ON:  10/21/2015, 12:35 PM [place] PH: [phone]   FX: [phone] [place]

## 2015-10-24 ENCOUNTER — Encounter: Payer: Self-pay | Admitting: Adult Health

## 2015-10-24 ENCOUNTER — Ambulatory Visit (INDEPENDENT_AMBULATORY_CARE_PROVIDER_SITE_OTHER): Payer: Medicaid Other | Admitting: Adult Health

## 2015-10-24 VITALS — BP 138/70 | HR 89 | Ht 64.0 in | Wt 240.0 lb

## 2015-10-24 DIAGNOSIS — R911 Solitary pulmonary nodule: Secondary | ICD-10-CM

## 2015-10-24 DIAGNOSIS — R0600 Dyspnea, unspecified: Secondary | ICD-10-CM | POA: Diagnosis not present

## 2015-10-24 DIAGNOSIS — J9611 Chronic respiratory failure with hypoxia: Secondary | ICD-10-CM | POA: Diagnosis not present

## 2015-10-24 DIAGNOSIS — G4733 Obstructive sleep apnea (adult) (pediatric): Secondary | ICD-10-CM | POA: Diagnosis not present

## 2015-10-24 DIAGNOSIS — J449 Chronic obstructive pulmonary disease, unspecified: Secondary | ICD-10-CM

## 2015-10-24 DIAGNOSIS — J9610 Chronic respiratory failure, unspecified whether with hypoxia or hypercapnia: Secondary | ICD-10-CM | POA: Insufficient documentation

## 2015-10-24 MED ORDER — UMECLIDINIUM-VILANTEROL 62.5-25 MCG/INH IN AEPB: 1.0000 | INHALATION_SPRAY | Freq: Every day | RESPIRATORY_TRACT | 5 refills | Status: DC

## 2015-10-24 NOTE — Assessment & Plan Note (Signed)
Severe obstructive sleep apnea Patient is to continue on C Pap at bedtime. C Pap download was requested. Will need to add in 1 L of oxygen with her C Pap..Marland Kitchen

## 2015-10-24 NOTE — Assessment & Plan Note (Signed)
Sleep study showed nocturnal desaturations despite C Pap. Will add in 1 L of oxygen with her C Pap machine.

## 2015-10-24 NOTE — Progress Notes (Signed)
Subjective:    Patient ID: Frances [name] RecordsZainab Manton, female    DOB: 12/18/1950, 65 y.o.   MRN: [id number]  HPI 65 year old female from [place] , smoker seen for pulmonary consult during hospitalization 10/07/2015 for acute on chronic hypoxic and hypercarbic respiratory failure, likely from obstructive sleep apnea and obesity-relation syndrome and possible underlying COPD.   10/24/2015 post hospital follow-up Patient returns for a post hospital follow-up. She was recently admitted for acute on chronic hypoxic and hypercarbic respiratory failure. CT chest was negative for PE. Did show a 7 mm right upper lobe nodule. 2-D echo showed an EF of 65-70%, grade 1 diastolic dysfunction and mild left atrial dilatation. sHe has been recommended for an outpatient sleep study as she has suspected underlying sleep apnea and obesity hypoventilation syndrome. Patient was treated with initial BiPAP support during hospital stay . She was discharged on a nocturnal C Pap on auto setting. Patient does not speak [place]. Patients son is with her who speaks [place]. She was set up for a sleep study on 8/7 that showed severe OSA with AHI at 44.9/hr.  Optimal C Pap pressure was 12 cm of H2O. Severe oxygen desaturation was noted with a minimum O2 saturation at 51%. Was receomended for her to use 1 l/m o2 with CPAP At bedtime  .  She says that she has been doing well on C Pap.. Was unable to get download at today's visit. A download from her DME company has been requested.  She says that she gets winded with activity. Has minimum cough. Denies any chest pain, orthopnea, PND or fever. Does have chronic lower extremity swelling Spirometry shows  She is from [place]. She has been in [place] for 1 year. She smoked 1 PPD for 55 yr. She lives with son and his family .  He says that she has not smoked since discharge.    Past Medical History:  Diagnosis Date  . Carotid artery occlusion   . History of dyspnea   . Peripheral vascular disease  (HCC)   . SOB (shortness of breath)   . Tricuspid regurgitation    Current Outpatient Prescriptions on File Prior to Visit  Medication Sig Dispense Refill  . nicotine (NICODERM CQ - DOSED IN MG/24 HOURS) 14 mg/24hr patch Place 1 patch (14 mg total) onto the skin daily. 28 patch 0  . aspirin EC 81 MG EC tablet Take 1 tablet (81 mg total) by mouth daily. (Patient not taking: Reported on 10/24/2015)    . furosemide (LASIX) 40 MG tablet Take 1 tablet (40 mg total) by mouth daily. (Patient not taking: Reported on 10/24/2015) 14 tablet 0  . gabapentin (NEURONTIN) 300 MG capsule Take 1 capsule (300 mg total) by mouth 2 (two) times daily. 14 capsule 0  . HYDROcodone-acetaminophen (NORCO/VICODIN) 5-325 MG tablet Take 1 tablet by mouth every 6 (six) hours as needed for severe pain (severe back pain). (Patient not taking: Reported on 10/24/2015) 15 tablet 0   No current facility-administered medications on file prior to visit.      Review of Systems Constitutional:   No  weight loss, night sweats,  Fevers, chills,  +fatigue, or  lassitude.  HEENT:   No headaches,  Difficulty swallowing,  Tooth/dental problems, or  Sore throat,                No sneezing, itching, ear ache, nasal congestion, post nasal drip,   CV:  No chest pain,  Orthopnea, PND, swelling in lower extremities, anasarca, dizziness, palpitations,  syncope.   GI  No heartburn, indigestion, abdominal pain, nausea, vomiting, diarrhea, change in bowel habits, loss of appetite, bloody stools.   Resp: +DOE  No excess mucus, no productive cough,  No non-productive cough,  No coughing up of blood.  No change in color of mucus.  No wheezing.  No chest wall deformity  Skin: no rash or lesions.  GU: no dysuria, change in color of urine, no urgency or frequency.  No flank pain, no hematuria   MS:  No joint pain or swelling.  No decreased range of motion.  No back pain.  Psych:  No change in mood or affect. No depression or anxiety.  No memory  loss.         Objective:   Physical Exam Vitals:   10/24/15 1607  BP: 138/70  Pulse: 89  SpO2: 90%  Weight: 240 lb (108.9 kg)  Height: 5\' 4"  (1.626 m)   GEN: A/Ox3; pleasant , NAD, in WC , obese    HEENT:  [place]/AT,  EACs-clear, TMs-wnl, NOSE-clear, THROAT-clear, no lesions, no postnasal drip or exudate noted.   NECK:  Supple w/ fair ROM; no JVD; normal carotid impulses w/o bruits; no thyromegaly or nodules palpated; no lymphadenopathy.    RESP  Clear  P & A; w/o, wheezes/ rales/ or rhonchi. no accessory muscle use, no dullness to percussion  CARD:  RRR, no m/r/g  , 1+ peripheral edema, pulses intact, no cyanosis or clubbing.  GI:   Soft & nt; nml bowel sounds; no organomegaly or masses detected.   Musco: Warm bil, no deformities or joint swelling noted.   Neuro: alert, no focal deficits noted.    Skin: Warm, no lesions or rashes  Tammy Parrett NP-C  [facility]  10/24/2015

## 2015-10-24 NOTE — Assessment & Plan Note (Signed)
Patient Instructions  Continue on CPAP At bedtime   CPAP download .  Add 1 l/m oxygen to CPAP At bedtime   Wear for at least 4-6hr each night and with naps.  Begin  ANORO 1 puff daily .  CT chest in 04/2016 to follow lung nodule  Follow up with Dr. Craige CottaSood in 2-3 months with PFT and As needed

## 2015-10-24 NOTE — Patient Instructions (Addendum)
Continue on CPAP At bedtime   CPAP download .  Add 1 l/m oxygen to CPAP At bedtime   Wear for at least 4-6hr each night and with naps.  Begin  ANORO 1 puff daily .  CT chest in 04/2016 to follow lung nodule  Follow up with Dr. Craige CottaSood in 2-3 months with PFT and As needed

## 2015-10-24 NOTE — Assessment & Plan Note (Signed)
CT chest showed a 7 mm right upper lobe nodule and a former heavy smoker Plan repeat CT chest in 6 months .

## 2016-01-03 ENCOUNTER — Other Ambulatory Visit: Payer: Self-pay | Admitting: Pulmonary Disease

## 2016-01-03 ENCOUNTER — Ambulatory Visit (INDEPENDENT_AMBULATORY_CARE_PROVIDER_SITE_OTHER): Payer: Medicaid Other | Admitting: *Deleted

## 2016-01-03 DIAGNOSIS — R0600 Dyspnea, unspecified: Secondary | ICD-10-CM

## 2016-01-03 DIAGNOSIS — Z23 Encounter for immunization: Secondary | ICD-10-CM | POA: Diagnosis present

## 2016-01-04 ENCOUNTER — Ambulatory Visit (INDEPENDENT_AMBULATORY_CARE_PROVIDER_SITE_OTHER): Payer: Medicaid Other | Admitting: Pulmonary Disease

## 2016-01-04 ENCOUNTER — Encounter: Payer: Self-pay | Admitting: Pulmonary Disease

## 2016-01-04 ENCOUNTER — Encounter (INDEPENDENT_AMBULATORY_CARE_PROVIDER_SITE_OTHER): Payer: Medicaid Other | Admitting: Pulmonary Disease

## 2016-01-04 VITALS — BP 130/82 | HR 104 | Ht 60.0 in | Wt 239.0 lb

## 2016-01-04 DIAGNOSIS — J9612 Chronic respiratory failure with hypercapnia: Secondary | ICD-10-CM

## 2016-01-04 DIAGNOSIS — R0600 Dyspnea, unspecified: Secondary | ICD-10-CM | POA: Diagnosis not present

## 2016-01-04 DIAGNOSIS — J9611 Chronic respiratory failure with hypoxia: Secondary | ICD-10-CM | POA: Diagnosis not present

## 2016-01-04 DIAGNOSIS — E662 Morbid (severe) obesity with alveolar hypoventilation: Secondary | ICD-10-CM | POA: Diagnosis not present

## 2016-01-04 DIAGNOSIS — J4520 Mild intermittent asthma, uncomplicated: Secondary | ICD-10-CM

## 2016-01-04 DIAGNOSIS — G4733 Obstructive sleep apnea (adult) (pediatric): Secondary | ICD-10-CM | POA: Diagnosis not present

## 2016-01-04 LAB — PULMONARY FUNCTION TEST
DL/VA % pred: 102 %
DL/VA: 4.36 ml/min/mmHg/L
DLCO COR % PRED: 72 %
DLCO UNC % PRED: 70 %
DLCO UNC: 13.38 ml/min/mmHg
DLCO cor: 13.69 ml/min/mmHg
FEF 25-75 Post: 1.2 L/sec
FEF 25-75 Pre: 0.73 L/sec
FEF2575-%Change-Post: 63 %
FEF2575-%PRED-POST: 76 %
FEF2575-%Pred-Pre: 46 %
FEV1-%CHANGE-POST: 16 %
FEV1-%PRED-POST: 88 %
FEV1-%Pred-Pre: 76 %
FEV1-POST: 1.42 L
FEV1-PRE: 1.22 L
FEV1FVC-%Change-Post: 20 %
FEV1FVC-%Pred-Pre: 84 %
FEV6-%Change-Post: -5 %
FEV6-%PRED-POST: 88 %
FEV6-%Pred-Pre: 94 %
FEV6-POST: 1.75 L
FEV6-PRE: 1.85 L
FEV6FVC-%CHANGE-POST: 0 %
FEV6FVC-%PRED-POST: 104 %
FEV6FVC-%PRED-PRE: 104 %
FVC-%Change-Post: -3 %
FVC-%PRED-PRE: 90 %
FVC-%Pred-Post: 86 %
FVC-POST: 1.79 L
FVC-Pre: 1.86 L
PRE FEV6/FVC RATIO: 100 %
Post FEV1/FVC ratio: 79 %
Post FEV6/FVC ratio: 100 %
Pre FEV1/FVC ratio: 66 %

## 2016-01-04 MED ORDER — ALBUTEROL SULFATE (2.5 MG/3ML) 0.083% IN NEBU: 2.5000 mg | INHALATION_SOLUTION | Freq: Four times a day (QID) | RESPIRATORY_TRACT | 5 refills | Status: AC

## 2016-01-04 NOTE — Progress Notes (Addendum)
Current Outpatient Prescriptions on File Prior to Visit  Medication Sig  . aspirin EC 81 MG EC tablet Take 1 tablet (81 mg total) by mouth daily.  . furosemide (LASIX) 40 MG tablet Take 1 tablet (40 mg total) by mouth daily.  Marland Kitchen. gabapentin (NEURONTIN) 300 MG capsule Take 1 capsule (300 mg total) by mouth 2 (two) times daily.  Marland Kitchen. HYDROcodone-acetaminophen (NORCO/VICODIN) 5-325 MG tablet Take 1 tablet by mouth every 6 (six) hours as needed for severe pain (severe back pain). (Patient not taking: Reported on 01/04/2016)   No current facility-administered medications on file prior to visit.      Chief Complaint  Patient presents with  . Follow-up    Appointment translated using video translator (ID [id number]) -- Some SOB with exertion, chest tightness and little cough. Little mucus production. Using O2 prn. Review PFT today.      Sleep tests PSG 10/16/15 >> AHI 44.9, SpO2 low 51  Pulmonary tests CT angio chest 10/07/15 >> 7 mm RUL nodule ABG 10/09/15 >> pH 7.43, PaCO2 59.2, PaO2 63.6 PFT 01/04/16 >> FEV1 1.22 (76%), FEV1% 66, TLC 2.78 (62%), DLCO 70%, +BD  Cardiac tests Echo 10/08/15 >> EF 65 to 70%, grade 1 DD  Past medical history PAD   Past surgical history, Family history, Social history, Allergies all reviewed.  Vital Signs BP 130/82 (BP Location: Right Arm, Cuff Size: Normal)   Pulse (!) 104   Ht 5' (1.524 m)   Wt 239 lb (108.4 kg)   SpO2 99%   BMI 46.68 kg/m   History of Present Illness Frances [name] is a 65 y.o. female with with OSA/OHS and lung nodule.  Interview with translator.  She was having trouble with snoring, and would stop breathing while asleep.  She was very sleepy during the day.   She had sleep study on 10/16/15 and found to have severe sleep apnea.  She uses CPAP and oxygen at night.  She is not sure this helps.  She thinks her mask is leaking.  She has tried using anoro.  Doesn't feel like it helps.  She is not having cough, wheeze, sputum, or chest  pain.  She has trouble paying for her CPAP machine and supplies.  Physical Exam  General - No distress ENT - No sinus tenderness, no oral exudate, no LAN Cardiac - s1s2 regular, no murmur Chest - No wheeze/rales/dullness Back - No focal tenderness Abd - Soft, non-tender Ext - No edema Neuro - Normal strength Skin - No rashes Psych - normal mood, and behavior   Assessment/Plan  Obstructive sleep apnea. - continue CPAP qhs  Obesity hypoventilation syndrome. - continue 1 liter oxygen with sleep  Rt upper lung nodule. - needs f/u CT chest in February 2018  COPD with asthma. - change to prn albuterol with nebulizer   Patient Instructions  Will arrange for home nebulizer  Use albuterol one vial every 6 hours as needed for cough, wheeze, or chest congestion  Will arrange for CPAP mask refitting  Follow up in 6 months  --------------------------------------            6                    6        Coralyn HellingVineet Mysha Peeler, MD [facility] Pager:  [phone] 01/04/2016, 11:50 AM

## 2016-01-04 NOTE — Addendum Note (Signed)
Addended by: Maisie FusGREEN, Marin Wisner M on: 01/04/2016 03:14 PM   Modules accepted: Orders

## 2016-01-04 NOTE — Addendum Note (Signed)
Addended by: Coralyn HellingSOOD, Ceniyah Thorp on: 01/04/2016 01:27 PM   Modules accepted: Orders

## 2016-01-04 NOTE — Patient Instructions (Addendum)
Will arrange for home nebulizer  Use albuterol one vial every 6 hours as needed for cough, wheeze, or chest congestion  Will arrange for CPAP mask refitting  Follow up in 6 months  --------------------------------------            6                    6

## 2016-01-11 MED FILL — ALBUTEROL 0.083% INHAL SOLN: (2.5 MG/3ML | 7 days supply | Qty: 90 | Fill #0

## 2016-01-31 ENCOUNTER — Telehealth: Payer: Self-pay | Admitting: Family Medicine

## 2016-01-31 MED ORDER — PERMETHRIN 5 % EX CREA: TOPICAL_CREAM | CUTANEOUS | 0 refills | Status: AC

## 2016-01-31 NOTE — Telephone Encounter (Signed)
Family member has lice - each member of family needs treatment. Son confirms no allergies Will rx permethrin for this patient  Latrelle DodrillBrittany J McIntyre, MD

## 2016-04-11 ENCOUNTER — Ambulatory Visit (INDEPENDENT_AMBULATORY_CARE_PROVIDER_SITE_OTHER)
Admission: RE | Admit: 2016-04-11 | Discharge: 2016-04-11 | Disposition: A | Discharge status: Home / Self Care | Payer: Medicaid Other | Source: Ambulatory Visit | Attending: Adult Health | Admitting: Adult Health

## 2016-04-11 DIAGNOSIS — R911 Solitary pulmonary nodule: Secondary | ICD-10-CM | POA: Diagnosis not present

## 2016-04-22 NOTE — Progress Notes (Signed)
Called patient in which I was informed by her son she spoke Arabic. [employer] interpreter [phone]) was called in which they translated the patient results. Pt asked when her appointment with Dr. Craige CottaSood was and was told it was 07/02/16 at 1030. Nothing further needed.

## 2016-05-21 ENCOUNTER — Telehealth: Payer: Self-pay | Admitting: Pulmonary Disease

## 2016-05-21 NOTE — Telephone Encounter (Signed)
Attempted to contact pt. No answer, no option to leave a message. Will try back.  

## 2016-05-22 NOTE — Telephone Encounter (Signed)
Attempted to contact pt x2. No answer, no option to leave a message. Will try back.  

## 2016-05-23 NOTE — Telephone Encounter (Signed)
Attempted to contact pt x3. No answer, no option to leave a message. Per triage protocol, message will be closed.  

## 2016-07-02 ENCOUNTER — Ambulatory Visit: Payer: Medicaid Other | Admitting: Pulmonary Disease

## 2016-09-03 ENCOUNTER — Ambulatory Visit: Payer: Medicaid Other | Admitting: Pulmonary Disease

## 2017-03-17 IMAGING — CT CT CHEST W/O CM
2 of 3 series · 15 of 36 positions shown, 18 images · non-contrast
Comparison: 10/07/2015

CLINICAL DATA: Two-month history of shortness of breath. Follow-up
of pulmonary nodule.

EXAM:
CT CHEST WITHOUT CONTRAST
TECHNIQUE: Multidetector CT imaging of the chest was performed following the
standard protocol without IV contrast.

[Series 2: thorax · axial · 0.75mm/px · z∈[-272,-30]mm · 12 of 143 slices shown, 15 images]
[im 11/143  mediastinal]
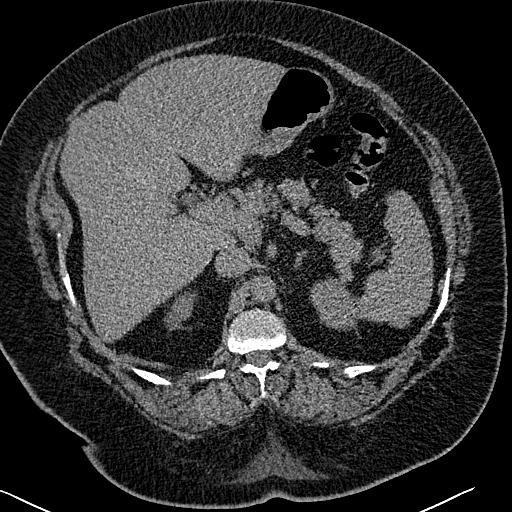
[im 11/143  lung]
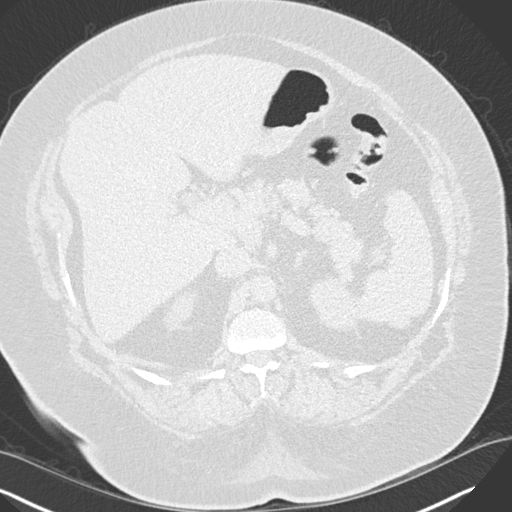
[im 22/143  lung]
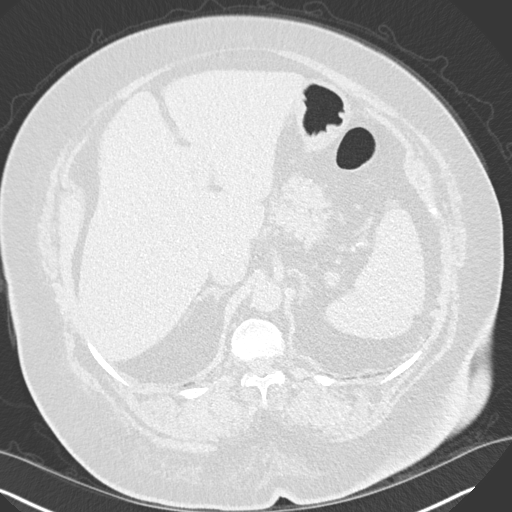
[im 32/143  lung]
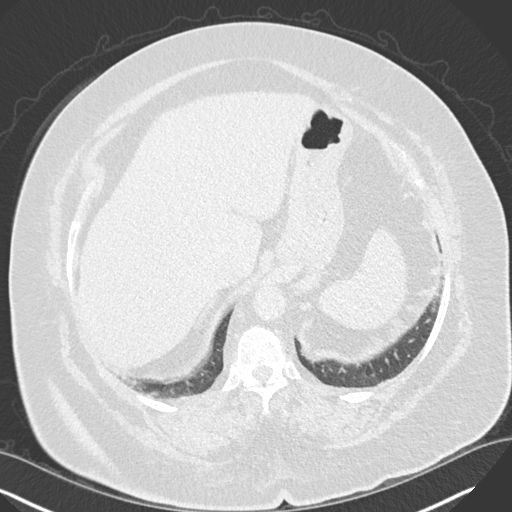
[im 43/143  lung]
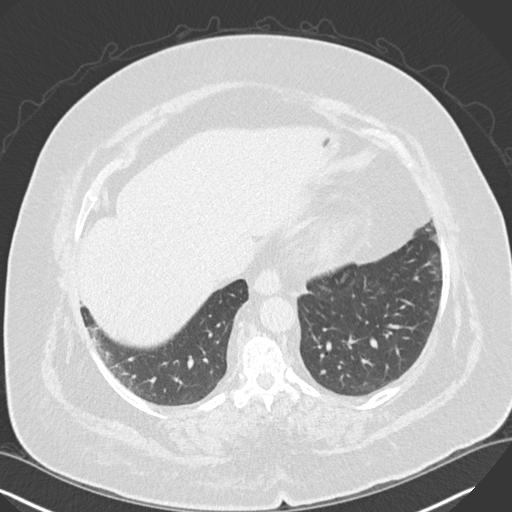
[im 53/143  mediastinal]
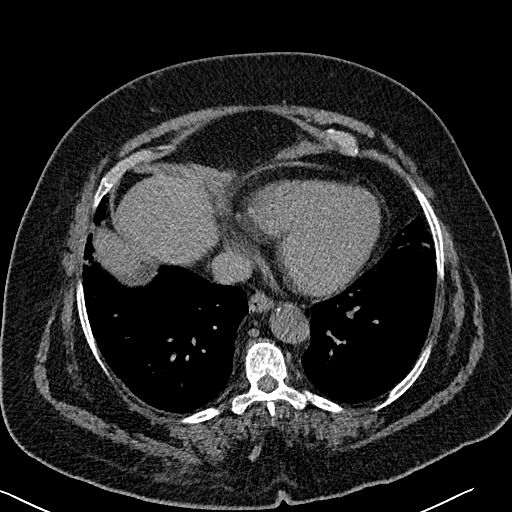
[im 53/143  lung]
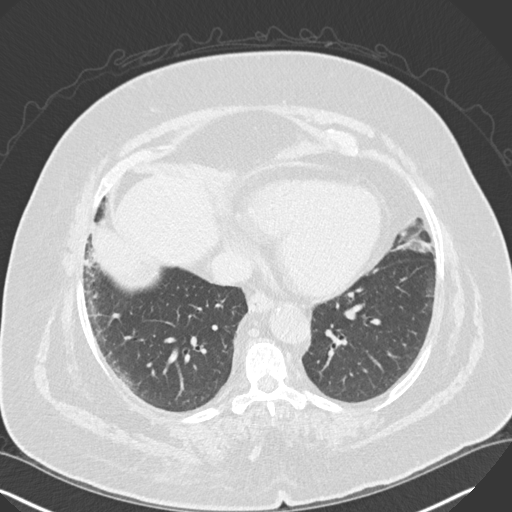
[im 64/143  lung]
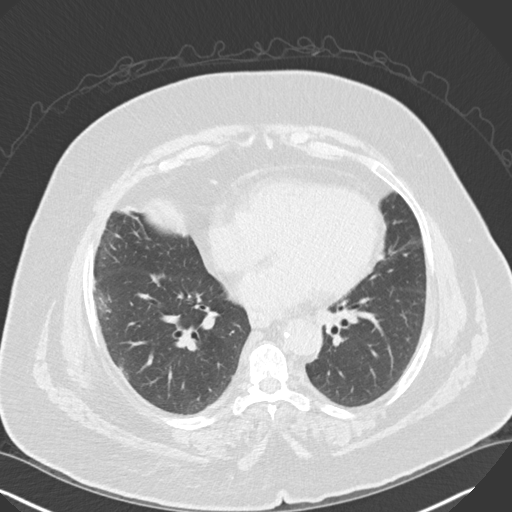
[im 79/143  lung]
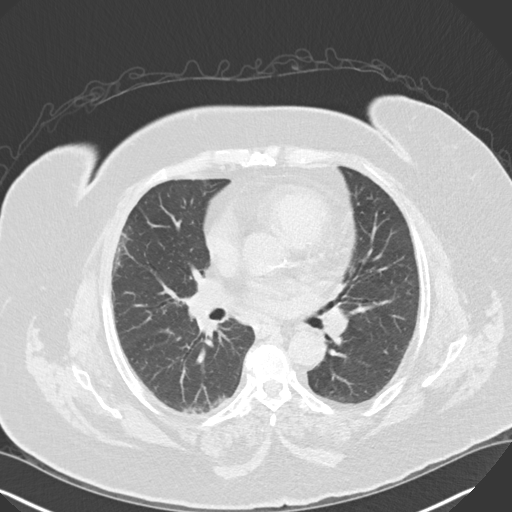
[im 90/143  lung]
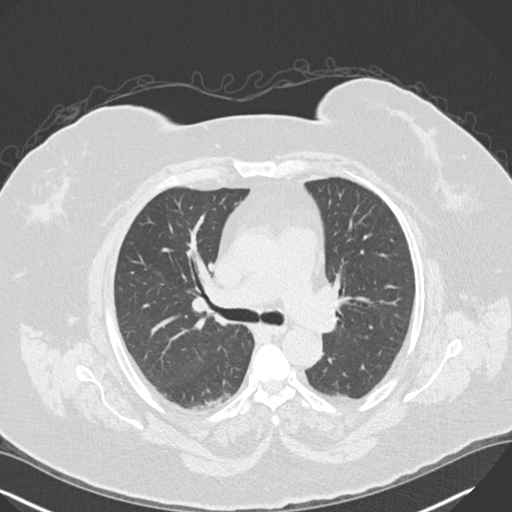
[im 100/143  mediastinal]
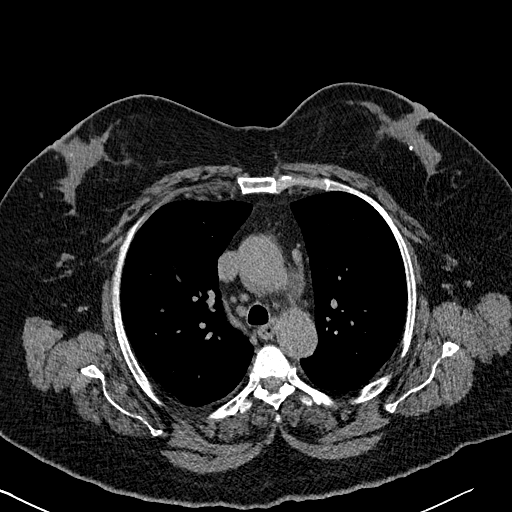
[im 100/143  lung]
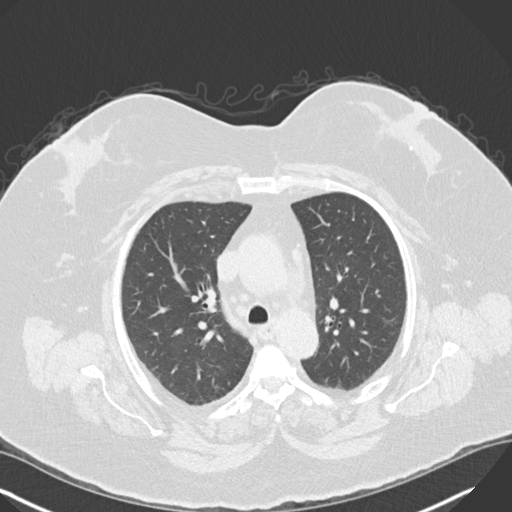
[im 111/143  lung]
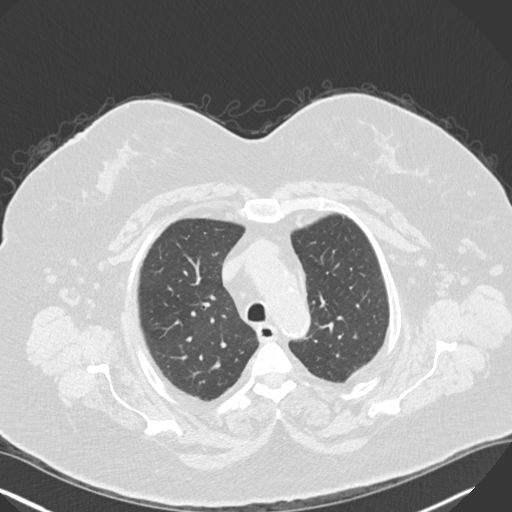
[im 121/143  lung]
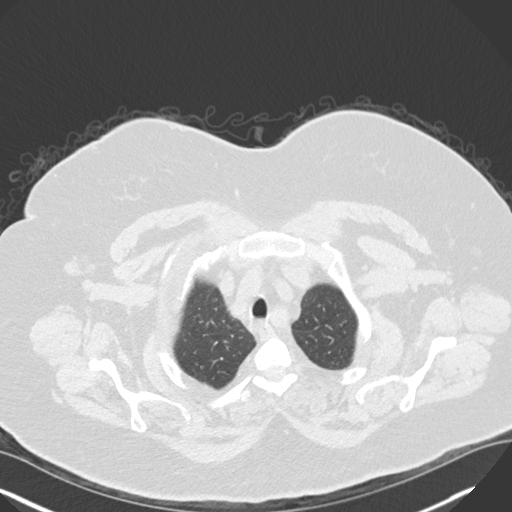
[im 132/143  lung]
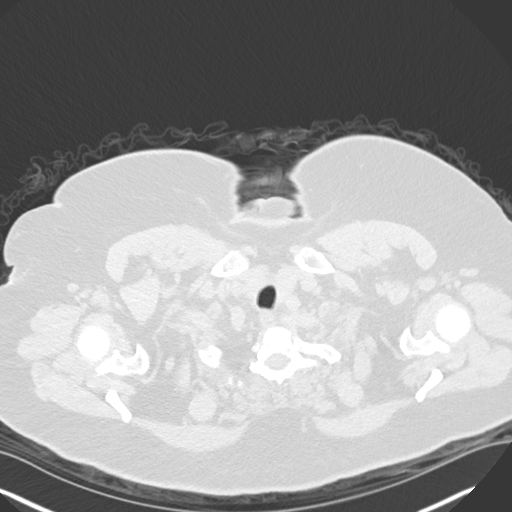

[Series 5: coronal · coronal · 0.59mm/px · 3 of 150 slices shown]
[im 30/150  lung]
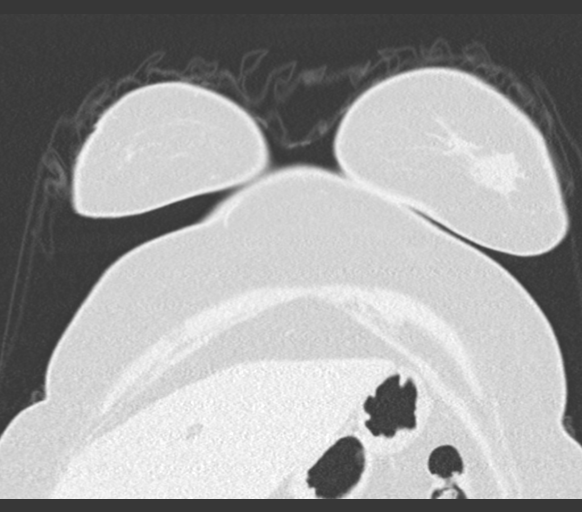
[im 60/150  lung]
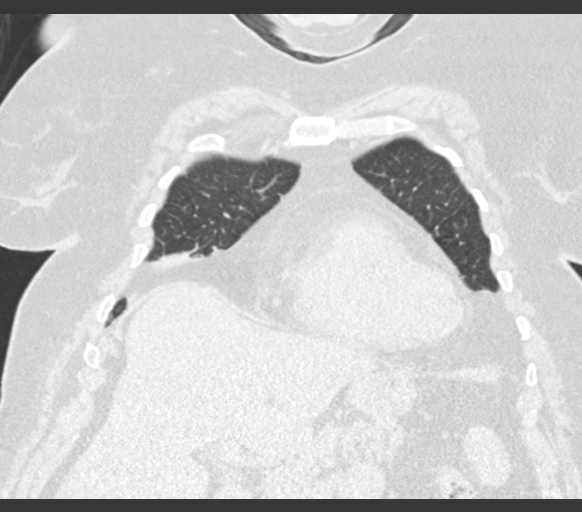
[im 90/150  lung]
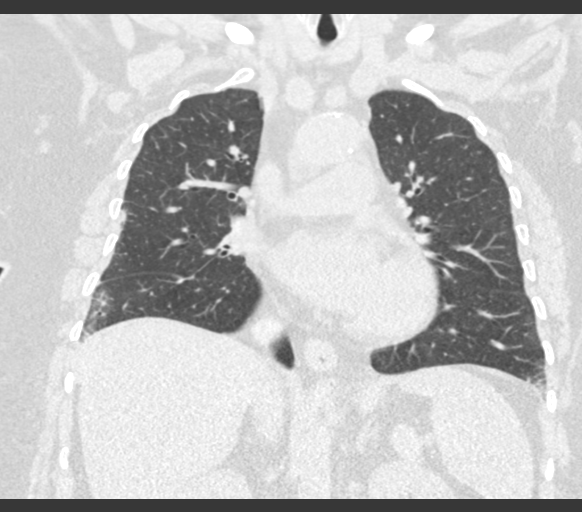

[15 of 36 positions shown; findings below may reference images not displayed]

FINDINGS: Cardiovascular: Aortic and branch vessel atherosclerosis. Tortuous
thoracic aorta. Mild cardiomegaly, without pericardial effusion.
Multivessel coronary artery atherosclerosis. Left main disease
identified on image 64/series 2.

Mediastinum/Nodes: Multiple small middle and anterior mediastinal
nodes are unchanged and likely reactive. None are pathologic by size
criteria. Hilar regions poorly evaluated without intravenous
contrast.

Lungs/Pleura: No pleural fluid.  Minimal motion degradation.

Resolution of right upper lobe 7 mm pulmonary nodule.

Dependent opacities in the lower lobes are likely due to
subsegmental atelectasis. There is also subpleural non dependent
interstitial thickening in the right lower lobe laterally on image
86/series 3 and within the lateral right upper lobe on image
61/series 3.

Upper Abdomen: Suspicion of hepatomegaly. Normal imaged portions of
the spleen, stomach, pancreas, adrenal glands, kidneys. Minimal left
adrenal nodularity is low-density, likely due to a subcentimeter
adenoma.

Musculoskeletal: Multiple small bilateral axillary nodes are not
significantly changed. None are pathologic by size criteria. Mild
thoracic spondylosis.
IMPRESSION: 1. The right upper lobe pulmonary nodule described on the prior exam
has resolved.
2.  No acute process in the chest.
3. Subpleural interstitial thickening dependently is likely related
to atelectasis. Areas of right-sided nondependent subpleural
interstitial prominence could represent mild interstitial lung
disease. Correlate with symptoms to suggest interstitial lung
disease. If this is a concern, high-resolution chest CT could be
performed in 1 year or more acutely.
4. Age advanced coronary artery atherosclerosis, including left main
disease. Recommend assessment of coronary risk factors and
consideration of medical therapy.

## 2023-01-10 DEATH — deceased
